# Patient Record
Sex: Male | Born: 1975 | Race: White | Hispanic: No | Marital: Married | Smoking: Current some day smoker
Health system: Southern US, Community
[De-identification: ages and names within clinical notes are randomized; demographics above are authoritative.]

## PROBLEM LIST (undated history)

## (undated) DIAGNOSIS — K259 Gastric ulcer, unspecified as acute or chronic, without hemorrhage or perforation: Secondary | ICD-10-CM

## (undated) DIAGNOSIS — M1990 Unspecified osteoarthritis, unspecified site: Secondary | ICD-10-CM

## (undated) DIAGNOSIS — K859 Acute pancreatitis without necrosis or infection, unspecified: Secondary | ICD-10-CM

## (undated) DIAGNOSIS — N4400 Torsion of testis, unspecified: Secondary | ICD-10-CM

## (undated) DIAGNOSIS — R569 Unspecified convulsions: Secondary | ICD-10-CM

## (undated) DIAGNOSIS — N451 Epididymitis: Secondary | ICD-10-CM

## (undated) HISTORY — PX: TONSILLECTOMY: SUR1361

## (undated) HISTORY — PX: KNEE ARTHROSCOPY: SUR90

## (undated) HISTORY — PX: TESTICULAR EXPLORATION: SHX5145

## (undated) HISTORY — PX: BACK SURGERY: SHX140

## (undated) HISTORY — PX: PALATE / UVULA BIOPSY / EXCISION: SUR128

## (undated) HISTORY — PX: SHOULDER ARTHROSCOPY: SHX128

## (undated) HISTORY — PX: ROTATOR CUFF REPAIR: SHX139

## (undated) HISTORY — PX: WISDOM TOOTH EXTRACTION: SHX21

---

## 2011-01-26 ENCOUNTER — Encounter: Payer: Self-pay | Admitting: *Deleted

## 2011-01-26 ENCOUNTER — Emergency Department (HOSPITAL_BASED_OUTPATIENT_CLINIC_OR_DEPARTMENT_OTHER)
Admission: EM | Admit: 2011-01-26 | Discharge: 2011-01-26 | Disposition: A | Discharge status: Home / Self Care | Payer: Managed Care, Other (non HMO) | Attending: Emergency Medicine | Admitting: Emergency Medicine

## 2011-01-26 ENCOUNTER — Other Ambulatory Visit: Payer: Self-pay

## 2011-01-26 DIAGNOSIS — T7840XA Allergy, unspecified, initial encounter: Secondary | ICD-10-CM

## 2011-01-26 DIAGNOSIS — Y92009 Unspecified place in unspecified non-institutional (private) residence as the place of occurrence of the external cause: Secondary | ICD-10-CM | POA: Insufficient documentation

## 2011-01-26 DIAGNOSIS — S60569A Insect bite (nonvenomous) of unspecified hand, initial encounter: Secondary | ICD-10-CM | POA: Insufficient documentation

## 2011-01-26 MED ORDER — DIPHENHYDRAMINE HCL 50 MG/ML IJ SOLN
25.0000 mg | Freq: Once | INTRAMUSCULAR | Status: AC
  Administered 2011-01-26: 25 mg via INTRAMUSCULAR
  Filled 2011-01-26: qty 1

## 2011-01-26 MED ORDER — METHYLPREDNISOLONE SODIUM SUCC 125 MG IJ SOLR
125.0000 mg | Freq: Once | INTRAMUSCULAR | Status: AC
  Administered 2011-01-26: 125 mg via INTRAMUSCULAR
  Filled 2011-01-26: qty 2

## 2011-01-26 NOTE — ED Notes (Signed)
Patient states he was picking up a old tire and felt a something bite or sting his right palm.  States pain progressively radiated up his right wrist into his upper arm to his chest.  Went to [facility] and received an injection of tordol. On the way home, he developed nausea, vomited x 1, diaphoretic , mid sternal chest pain.  States after vomiting, he feels better.  Is having some dizziness now.

## 2011-01-26 NOTE — ED Notes (Signed)
Patient also states he received a flu shot from his PCP's office prior to leaving. Patient states he has never had a flu shot before.

## 2011-01-26 NOTE — ED Provider Notes (Signed)
History     CSN: [id number] Arrival date & time: 01/26/2011  7:18 PM  Chief Complaint  Patient presents with  . Insect Bite    right palm    (Consider location/radiation/quality/duration/timing/severity/associated sxs/prior treatment) HPI Comments: Pt states that he had some type of insect bite earlier today and he was having a lot of pain in his hand and his wrist so he was seen at [facility] and given a shot of toradol. Pt states that he was also given a flu shot while he was there. Pt states that on his was home he got diaphoretic, had mid sternal cp, vomited times one and his eyes are very itch and he is dizzy:pt state that he has no history of flu shot:pt state that he is feeling a lot better after vomiting although the symptoms have not completely resolved  The history is provided by the patient. No language interpreter was used.    History reviewed. No pertinent past medical history.  Past Surgical History  Procedure Date  . Knee arthroscopy     right and left  . Rotator cuff repair     left  . Shoulder arthroscopy     left  . Tonsillectomy   . Palate / uvula biopsy / excision   . Wisdom tooth extraction   . Testicular exploration     No family history on file.  History  Substance Use Topics  . Smoking status: Current Some Day Smoker  . Smokeless tobacco: Current User    Types: Chew  . Alcohol Use: Yes      Review of Systems  All other systems reviewed and are negative.    Allergies  Review of patient's allergies indicates no known allergies.  Home Medications  No current outpatient prescriptions on file.  BP 144/90  Pulse 76  Temp(Src) 99.4 F (37.4 C) (Oral)  Resp 20  Ht 5\' 11"  (1.803 m)  Wt 227 lb (102.967 kg)  BMI 31.66 kg/m2  SpO2 99%  Physical Exam  Nursing note and vitals reviewed. Constitutional: He is oriented to person, place, and time. He appears well-developed and well-nourished.  HENT:  Head: Normocephalic and atraumatic.    Mouth/Throat: Oropharynx is clear and moist.  Eyes: Conjunctivae are normal. Pupils are equal, round, and reactive to light.  Cardiovascular: Normal rate and regular rhythm.   Pulmonary/Chest: Effort normal and breath sounds normal.  Abdominal: Soft. Bowel sounds are normal.  Musculoskeletal: Normal range of motion.  Neurological: He is alert and oriented to person, place, and time.    ED Course  Procedures (including critical care time)     Date: 01/26/2011  Rate:62  Rhythm: normal sinus rhythm  QRS Axis: normal  Intervals: normal  ST/T Wave abnormalities: early repolarization  Conduction Disutrbances:none  Narrative Interpretation:   Old EKG Reviewed: none available     MDM  Pt is feeling better after the solumedrol and benadryl:likely reaction although unsure if related to one of the 2 medications or the insect bite        Teressa Lower, NP 01/26/11 2031

## 2011-01-26 NOTE — ED Provider Notes (Signed)
Evaluation and management procedures were performed by the mid-level provider (PA/NP/CNM) under my supervision/collaboration. I was present and available during the ED course. Ralph Spivack Y.    Ralph Fowler. Oletta Lamas, MD 01/26/11 2031

## 2012-09-18 DIAGNOSIS — G40209 Localization-related (focal) (partial) symptomatic epilepsy and epileptic syndromes with complex partial seizures, not intractable, without status epilepticus: Secondary | ICD-10-CM | POA: Insufficient documentation

## 2013-06-06 ENCOUNTER — Encounter (HOSPITAL_BASED_OUTPATIENT_CLINIC_OR_DEPARTMENT_OTHER): Payer: Self-pay | Admitting: Emergency Medicine

## 2013-06-06 ENCOUNTER — Emergency Department (HOSPITAL_BASED_OUTPATIENT_CLINIC_OR_DEPARTMENT_OTHER)
Admission: EM | Admit: 2013-06-06 | Discharge: 2013-06-06 | Disposition: A | Discharge status: Home / Self Care | Payer: Managed Care, Other (non HMO) | Attending: Emergency Medicine | Admitting: Emergency Medicine

## 2013-06-06 DIAGNOSIS — Y92410 Unspecified street and highway as the place of occurrence of the external cause: Secondary | ICD-10-CM | POA: Insufficient documentation

## 2013-06-06 DIAGNOSIS — Z79899 Other long term (current) drug therapy: Secondary | ICD-10-CM | POA: Insufficient documentation

## 2013-06-06 DIAGNOSIS — Y9389 Activity, other specified: Secondary | ICD-10-CM | POA: Insufficient documentation

## 2013-06-06 DIAGNOSIS — S335XXA Sprain of ligaments of lumbar spine, initial encounter: Secondary | ICD-10-CM | POA: Insufficient documentation

## 2013-06-06 DIAGNOSIS — Z7982 Long term (current) use of aspirin: Secondary | ICD-10-CM | POA: Insufficient documentation

## 2013-06-06 DIAGNOSIS — F17200 Nicotine dependence, unspecified, uncomplicated: Secondary | ICD-10-CM | POA: Insufficient documentation

## 2013-06-06 DIAGNOSIS — S39012A Strain of muscle, fascia and tendon of lower back, initial encounter: Secondary | ICD-10-CM

## 2013-06-06 HISTORY — DX: Epididymitis: N45.1

## 2013-06-06 HISTORY — DX: Torsion of testis, unspecified: N44.00

## 2013-06-06 HISTORY — DX: Unspecified convulsions: R56.9

## 2013-06-06 HISTORY — DX: Acute pancreatitis without necrosis or infection, unspecified: K85.90

## 2013-06-06 HISTORY — DX: Gastric ulcer, unspecified as acute or chronic, without hemorrhage or perforation: K25.9

## 2013-06-06 MED ORDER — CYCLOBENZAPRINE HCL 10 MG PO TABS: 10.0000 mg | ORAL_TABLET | Freq: Three times a day (TID) | ORAL | Status: DC | PRN
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
## 2013-06-06 NOTE — ED Notes (Signed)
MD at bedside. 
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
## 2013-06-06 NOTE — Discharge Instructions (Signed)
Lumbosacral Strain Lumbosacral strain is a strain of any of the parts that make up your lumbosacral vertebrae. Your lumbosacral vertebrae are the bones that make up the lower third of your backbone. Your lumbosacral vertebrae are held together by muscles and tough, fibrous tissue (ligaments).  CAUSES  A sudden blow to your back can cause lumbosacral strain. Also, anything that causes an excessive stretch of the muscles in the low back can cause this strain. This is typically seen when people exert themselves strenuously, fall, lift heavy objects, bend, or crouch repeatedly. RISK FACTORS  Physically demanding work.  Participation in pushing or pulling sports or sports that require sudden twist of the back (tennis, golf, baseball).  Weight lifting.  Excessive lower back curvature.  Forward-tilted pelvis.  Weak back or abdominal muscles or both.  Tight hamstrings. SIGNS AND SYMPTOMS  Lumbosacral strain may cause pain in the area of your injury or pain that moves (radiates) down your leg.  DIAGNOSIS Your health care provider can often diagnose lumbosacral strain through a physical exam. In some cases, you may need tests such as X-ray exams.  TREATMENT  Treatment for your lower back injury depends on many factors that your clinician will have to evaluate. However, most treatment will include the use of anti-inflammatory medicines. HOME CARE INSTRUCTIONS   Avoid hard physical activities (tennis, racquetball, waterskiing) if you are not in proper physical condition for it. This may aggravate or create problems.  If you have a back problem, avoid sports requiring sudden body movements. Swimming and walking are generally safer activities.  Maintain good posture.  Maintain a healthy weight.  For acute conditions, you may put ice on the injured area.  Put ice in a plastic bag.  Place a towel between your skin and the bag.  Leave the ice on for 20 minutes, 2 3 times a day.  When the  low back starts healing, stretching and strengthening exercises may be recommended. SEEK MEDICAL CARE IF:  Your back pain is getting worse.  You experience severe back pain not relieved with medicines. SEEK IMMEDIATE MEDICAL CARE IF:   You have numbness, tingling, weakness, or problems with the use of your arms or legs.  There is a change in bowel or bladder control.  You have increasing pain in any area of the body, including your belly (abdomen).  You notice shortness of breath, dizziness, or feel faint.  You feel sick to your stomach (nauseous), are throwing up (vomiting), or become sweaty.  You notice discoloration of your toes or legs, or your feet get very cold. MAKE SURE YOU:   Understand these instructions.  Will watch your condition.  Will get help right away if you are not doing well or get worse. Document Released: 01/26/2005 Document Revised: 02/06/2013 Document Reviewed: 12/05/2012 Exit[employer] Patient Information 2014 [employer].  

## 2014-07-15 ENCOUNTER — Encounter (HOSPITAL_COMMUNITY): Payer: Self-pay

## 2014-07-15 ENCOUNTER — Encounter (HOSPITAL_COMMUNITY)
Admission: RE | Admit: 2014-07-15 | Discharge: 2014-07-15 | Disposition: A | Discharge status: Home / Self Care | Payer: BC Managed Care – PPO | Source: Ambulatory Visit | Attending: Orthopedic Surgery | Admitting: Orthopedic Surgery

## 2014-07-15 DIAGNOSIS — R569 Unspecified convulsions: Secondary | ICD-10-CM

## 2014-07-15 DIAGNOSIS — Z01812 Encounter for preprocedural laboratory examination: Secondary | ICD-10-CM | POA: Insufficient documentation

## 2014-07-15 LAB — ABO/RH: ABO/RH(D): O POS

## 2014-07-15 LAB — TYPE AND SCREEN
ABO/RH(D): O POS
ANTIBODY SCREEN: NEGATIVE

## 2014-07-15 LAB — BASIC METABOLIC PANEL
Anion gap: 8 (ref 5–15)
BUN: 8 mg/dL (ref 6–23)
CALCIUM: 8.8 mg/dL (ref 8.4–10.5)
CHLORIDE: 107 mmol/L (ref 96–112)
CO2: 26 mmol/L (ref 19–32)
CREATININE: 1.1 mg/dL (ref 0.50–1.35)
GFR calc Af Amer: >90 mL/min (ref >90)
GFR calc non Af Amer: 84 mL/min — ABNORMAL LOW (ref >90)
GLUCOSE: 122 mg/dL — AB (ref 70–99)
POTASSIUM: 3.8 mmol/L (ref 3.5–5.1)
SODIUM: 141 mmol/L (ref 135–145)

## 2014-07-15 LAB — CBC
HEMATOCRIT: 45 % (ref 39.0–52.0)
Hemoglobin: 15.4 g/dL (ref 13.0–17.0)
MCH: 30.5 pg (ref 26.0–34.0)
MCHC: 34.2 g/dL (ref 30.0–36.0)
MCV: 89.1 fL (ref 78.0–100.0)
Platelets: 153 10*3/uL (ref 150–400)
RBC: 5.05 MIL/uL (ref 4.22–5.81)
RDW: 13.7 % (ref 11.5–15.5)
WBC: 5.9 10*3/uL (ref 4.0–10.5)

## 2014-07-15 LAB — SURGICAL PCR SCREEN
MRSA, PCR: NEGATIVE
Staphylococcus aureus: NEGATIVE

## 2014-07-15 NOTE — Pre-Procedure Instructions (Signed)
Venetia ConstableGary Speir  07/15/2014   Your procedure is scheduled on: Thursday, March 17th   [facility]mitting at 5:30 AM.   Call this number if you have problems the morning of surgery: [phone]   Remember:   Do not eat food or drink liquids after midnight Wednesday.   Take these medicines the morning of surgery with A SIP OF WATER: Dexilant, Oxycodone   Do not wear jewelry - no rings or watches.  Do not wear lotions or colognes.   You may NOT wear deodorant the day of surgery.   Men may shave face and neck.   Do not bring valuables to the hospital.  [facility] is not responsible for any belongings or valuables.               Contacts, dentures or bridgework may not be worn into surgery.  Leave suitcase in the car. After surgery it may be brought to your room.  For patients admitted to the hospital, discharge time is determined by your treatment team.    Name and phone number of your driver:    Special Instructions: "Preparing for Surgery" instruction sheet.   Please read over the following fact sheets that you were given: Pain Booklet, Coughing and Deep Breathing, Blood Transfusion Information, MRSA Information and Surgical Site Infection Prevention

## 2014-07-15 NOTE — Progress Notes (Addendum)
Neuro is Dr. Fae PippinGuzick @ [facility]. [phone]).  Per her instructions, she has given him instructions regarding getting off the keppra.  Last seizure 06/2012.  Denies any cardiac problems.  Just seasonal allergies. PCP is Dr. Jacqlyn LarsenBulla  @ [facility] in [place].   LOV was acouple of weeks ago.

## 2014-07-16 MED ORDER — VANCOMYCIN HCL 10 G IV SOLR
1500.0000 mg | INTRAVENOUS | Status: AC
  Administered 2014-07-17: 1500 mg via INTRAVENOUS
  Filled 2014-07-16: qty 1500

## 2014-07-16 NOTE — Anesthesia Preprocedure Evaluation (Addendum)
Anesthesia Evaluation  Patient identified by MRN, date of birth, ID band Patient awake    Reviewed: Allergy & Precautions, NPO status , Patient's Chart, lab work & pertinent test results  Airway Mallampati: II   Neck ROM: Full    Dental  (+) Dental Advisory Given, Teeth Intact   Pulmonary former smoker,  breath sounds clear to auscultation        Cardiovascular negative cardio ROS  Rhythm:Regular     Neuro/Psych Seizures -,     GI/Hepatic Neg liver ROS, PUD,   Endo/Other  negative endocrine ROS  Renal/GU negative Renal ROS     Musculoskeletal   Abdominal (+) + obese,   Peds  Hematology 15/45   Anesthesia Other Findings   Reproductive/Obstetrics                            Anesthesia Physical Anesthesia Plan  ASA: II  Anesthesia Plan: General   Post-op Pain Management:    Induction: Intravenous  Airway Management Planned: Oral ETT  Additional Equipment:   Intra-op Plan:   Post-operative Plan: Extubation in OR  Informed Consent: I have reviewed the patients History and Physical, chart, labs and discussed the procedure including the risks, benefits and alternatives for the proposed anesthesia with the patient or authorized representative who has indicated his/her understanding and acceptance.     Plan Discussed with:   Anesthesia Plan Comments:         Anesthesia Quick Evaluation

## 2014-07-17 ENCOUNTER — Inpatient Hospital Stay (HOSPITAL_COMMUNITY): Payer: BC Managed Care – PPO

## 2014-07-17 ENCOUNTER — Inpatient Hospital Stay (HOSPITAL_COMMUNITY): Payer: BC Managed Care – PPO | Admitting: Anesthesiology

## 2014-07-17 ENCOUNTER — Encounter (HOSPITAL_COMMUNITY): Payer: Self-pay | Admitting: *Deleted

## 2014-07-17 ENCOUNTER — Inpatient Hospital Stay (HOSPITAL_COMMUNITY)
Admission: RE | Admit: 2014-07-17 | Discharge: 2014-07-23 | DRG: 459 | Disposition: A | Discharge status: Home / Self Care | Payer: BC Managed Care – PPO | Source: Ambulatory Visit | Attending: Orthopedic Surgery | Admitting: Orthopedic Surgery

## 2014-07-17 ENCOUNTER — Encounter (HOSPITAL_COMMUNITY)
Admission: RE | Disposition: A | Discharge status: Home / Self Care | Payer: Self-pay | Source: Ambulatory Visit | Attending: Orthopedic Surgery

## 2014-07-17 DIAGNOSIS — Z6834 Body mass index (BMI) 34.0-34.9, adult: Secondary | ICD-10-CM

## 2014-07-17 DIAGNOSIS — Z9889 Other specified postprocedural states: Secondary | ICD-10-CM

## 2014-07-17 DIAGNOSIS — G9340 Encephalopathy, unspecified: Secondary | ICD-10-CM | POA: Diagnosis not present

## 2014-07-17 DIAGNOSIS — M4316 Spondylolisthesis, lumbar region: Principal | ICD-10-CM | POA: Diagnosis present

## 2014-07-17 DIAGNOSIS — G40909 Epilepsy, unspecified, not intractable, without status epilepticus: Secondary | ICD-10-CM | POA: Diagnosis present

## 2014-07-17 DIAGNOSIS — R4182 Altered mental status, unspecified: Secondary | ICD-10-CM | POA: Insufficient documentation

## 2014-07-17 DIAGNOSIS — Z01812 Encounter for preprocedural laboratory examination: Secondary | ICD-10-CM

## 2014-07-17 DIAGNOSIS — E669 Obesity, unspecified: Secondary | ICD-10-CM | POA: Diagnosis present

## 2014-07-17 DIAGNOSIS — M549 Dorsalgia, unspecified: Secondary | ICD-10-CM | POA: Diagnosis present

## 2014-07-17 DIAGNOSIS — Z981 Arthrodesis status: Secondary | ICD-10-CM

## 2014-07-17 DIAGNOSIS — M5489 Other dorsalgia: Secondary | ICD-10-CM | POA: Diagnosis present

## 2014-07-17 DIAGNOSIS — Z419 Encounter for procedure for purposes other than remedying health state, unspecified: Secondary | ICD-10-CM

## 2014-07-17 DIAGNOSIS — G8918 Other acute postprocedural pain: Secondary | ICD-10-CM | POA: Insufficient documentation

## 2014-07-17 DIAGNOSIS — R410 Disorientation, unspecified: Secondary | ICD-10-CM | POA: Diagnosis not present

## 2014-07-17 DIAGNOSIS — R404 Transient alteration of awareness: Secondary | ICD-10-CM | POA: Diagnosis not present

## 2014-07-17 DIAGNOSIS — F17220 Nicotine dependence, chewing tobacco, uncomplicated: Secondary | ICD-10-CM | POA: Diagnosis present

## 2014-07-17 HISTORY — PX: TRANSFORAMINAL LUMBAR INTERBODY FUSION (TLIF) WITH PEDICLE SCREW FIXATION 1 LEVEL: SHX6141

## 2014-07-17 HISTORY — DX: Unspecified osteoarthritis, unspecified site: M19.90

## 2014-07-17 LAB — CBC
HEMATOCRIT: 40.5 % (ref 39.0–52.0)
HEMOGLOBIN: 13.4 g/dL (ref 13.0–17.0)
MCH: 29.9 pg (ref 26.0–34.0)
MCHC: 33.1 g/dL (ref 30.0–36.0)
MCV: 90.4 fL (ref 78.0–100.0)
Platelets: 167 10*3/uL (ref 150–400)
RBC: 4.48 MIL/uL (ref 4.22–5.81)
RDW: 13.4 % (ref 11.5–15.5)
WBC: 13 10*3/uL — ABNORMAL HIGH (ref 4.0–10.5)

## 2014-07-17 LAB — HEPATIC FUNCTION PANEL
ALBUMIN: 4.3 g/dL (ref 3.5–5.2)
ALT: 38 U/L (ref 0–53)
AST: 67 U/L — ABNORMAL HIGH (ref 0–37)
Alkaline Phosphatase: 65 U/L (ref 39–117)
Bilirubin, Direct: 0.2 mg/dL (ref 0.0–0.5)
Indirect Bilirubin: 0.7 mg/dL (ref 0.3–0.9)
Total Bilirubin: 0.9 mg/dL (ref 0.3–1.2)
Total Protein: 6.8 g/dL (ref 6.0–8.3)

## 2014-07-17 LAB — POCT I-STAT 3, ART BLOOD GAS (G3+)
ACID-BASE DEFICIT: 1 mmol/L (ref 0.0–2.0)
BICARBONATE: 23.2 meq/L (ref 20.0–24.0)
O2 SAT: 92 %
PH ART: 7.421 (ref 7.350–7.450)
PO2 ART: 63 mmHg — AB (ref 80.0–100.0)
TCO2: 24 mmol/L (ref 0–100)
pCO2 arterial: 35.9 mmHg (ref 35.0–45.0)

## 2014-07-17 LAB — BASIC METABOLIC PANEL
Anion gap: 8 (ref 5–15)
BUN: 7 mg/dL (ref 6–23)
CALCIUM: 9.1 mg/dL (ref 8.4–10.5)
CO2: 27 mmol/L (ref 19–32)
CREATININE: 1.26 mg/dL (ref 0.50–1.35)
Chloride: 108 mmol/L (ref 96–112)
GFR calc non Af Amer: 72 mL/min — ABNORMAL LOW (ref >90)
GFR, EST AFRICAN AMERICAN: 83 mL/min — AB (ref >90)
Glucose, Bld: 115 mg/dL — ABNORMAL HIGH (ref 70–99)
POTASSIUM: 4 mmol/L (ref 3.5–5.1)
Sodium: 143 mmol/L (ref 135–145)

## 2014-07-17 LAB — GLUCOSE, CAPILLARY
GLUCOSE-CAPILLARY: 110 mg/dL — AB (ref 70–99)
Glucose-Capillary: 129 mg/dL — ABNORMAL HIGH (ref 70–99)

## 2014-07-17 LAB — AMMONIA: AMMONIA: 39 umol/L — AB (ref 11–32)

## 2014-07-17 LAB — TSH: TSH: 0.197 u[IU]/mL — ABNORMAL LOW (ref 0.350–4.500)

## 2014-07-17 SURGERY — POSTERIOR LUMBAR FUSION 1 LEVEL
Anesthesia: General | Site: Back

## 2014-07-17 MED ORDER — MORPHINE SULFATE 2 MG/ML IJ SOLN
INTRAMUSCULAR | Status: AC
  Administered 2014-07-17: 2 mg via INTRAVENOUS
  Filled 2014-07-17: qty 1

## 2014-07-17 MED ORDER — DEXMEDETOMIDINE BOLUS VIA INFUSION
1.0000 ug/kg | Freq: Once | INTRAVENOUS | Status: AC
  Administered 2014-07-17: 112.5 ug via INTRAVENOUS

## 2014-07-17 MED ORDER — OXYCODONE HCL 5 MG PO TABS
ORAL_TABLET | ORAL | Status: AC
  Administered 2014-07-17: 10 mg
  Filled 2014-07-17: qty 2

## 2014-07-17 MED ORDER — FENTANYL CITRATE 0.05 MG/ML IJ SOLN
25.0000 ug | INTRAMUSCULAR | Status: DC | PRN
  Administered 2014-07-17 (×2): 50 ug via INTRAVENOUS
  Administered 2014-07-17 (×2): 25 ug via INTRAVENOUS

## 2014-07-17 MED ORDER — ONDANSETRON HCL 4 MG/2ML IJ SOLN
INTRAMUSCULAR | Status: AC
  Filled 2014-07-17: qty 2

## 2014-07-17 MED ORDER — OXYCODONE HCL 5 MG PO TABS
ORAL_TABLET | ORAL | Status: AC
  Administered 2014-07-17: 10 mg via ORAL
  Filled 2014-07-17: qty 2

## 2014-07-17 MED ORDER — LACTATED RINGERS IV SOLN
INTRAVENOUS | Status: DC
  Administered 2014-07-18: 01:00:00 via INTRAVENOUS

## 2014-07-17 MED ORDER — SODIUM CHLORIDE 0.9 % IV SOLN: 250.0000 mL | INTRAVENOUS | Status: DC

## 2014-07-17 MED ORDER — ACETAMINOPHEN 10 MG/ML IV SOLN
1000.0000 mg | Freq: Four times a day (QID) | INTRAVENOUS | Status: DC
  Filled 2014-07-17 (×2): qty 100

## 2014-07-17 MED ORDER — MORPHINE SULFATE 2 MG/ML IJ SOLN
INTRAMUSCULAR | Status: AC
  Filled 2014-07-17: qty 1

## 2014-07-17 MED ORDER — LACTATED RINGERS IV SOLN
INTRAVENOUS | Status: DC | PRN
  Administered 2014-07-17: 07:00:00 via INTRAVENOUS

## 2014-07-17 MED ORDER — METHOCARBAMOL 500 MG PO TABS
500.0000 mg | ORAL_TABLET | Freq: Four times a day (QID) | ORAL | Status: DC | PRN
  Administered 2014-07-17 – 2014-07-23 (×17): 500 mg via ORAL
  Filled 2014-07-17 (×20): qty 1

## 2014-07-17 MED ORDER — SODIUM CHLORIDE 0.9 % IJ SOLN: 3.0000 mL | INTRAMUSCULAR | Status: DC | PRN

## 2014-07-17 MED ORDER — LIDOCAINE HCL (CARDIAC) 20 MG/ML IV SOLN
INTRAVENOUS | Status: DC | PRN
  Administered 2014-07-17: 50 mg via INTRAVENOUS
  Administered 2014-07-17: 100 mg via INTRAVENOUS

## 2014-07-17 MED ORDER — THROMBIN 20000 UNITS EX SOLR
CUTANEOUS | Status: DC | PRN
  Administered 2014-07-17: 20 mL via TOPICAL

## 2014-07-17 MED ORDER — MORPHINE SULFATE 2 MG/ML IJ SOLN
1.0000 mg | INTRAMUSCULAR | Status: DC | PRN
  Administered 2014-07-17 (×2): 2 mg via INTRAVENOUS

## 2014-07-17 MED ORDER — ALBUMIN HUMAN 5 % IV SOLN
INTRAVENOUS | Status: DC | PRN
  Administered 2014-07-17 (×2): via INTRAVENOUS

## 2014-07-17 MED ORDER — FENTANYL CITRATE 0.05 MG/ML IJ SOLN
INTRAMUSCULAR | Status: AC
  Filled 2014-07-17: qty 5

## 2014-07-17 MED ORDER — ACETAMINOPHEN 325 MG PO TABS
ORAL_TABLET | ORAL | Status: AC
  Filled 2014-07-17: qty 2

## 2014-07-17 MED ORDER — DIPHENHYDRAMINE HCL 50 MG/ML IJ SOLN: 50.0000 mg | Freq: Once | INTRAMUSCULAR | Status: DC

## 2014-07-17 MED ORDER — DEXAMETHASONE SODIUM PHOSPHATE 4 MG/ML IJ SOLN
4.0000 mg | Freq: Four times a day (QID) | INTRAMUSCULAR | Status: DC
  Administered 2014-07-18 (×2): 4 mg via INTRAVENOUS
  Filled 2014-07-17 (×6): qty 1

## 2014-07-17 MED ORDER — MIDAZOLAM HCL 5 MG/5ML IJ SOLN
INTRAMUSCULAR | Status: DC | PRN
  Administered 2014-07-17 (×2): 2 mg via INTRAVENOUS

## 2014-07-17 MED ORDER — FENTANYL CITRATE 0.05 MG/ML IJ SOLN
INTRAMUSCULAR | Status: AC
  Administered 2014-07-17: 50 ug via INTRAVENOUS
  Filled 2014-07-17: qty 2

## 2014-07-17 MED ORDER — ONDANSETRON HCL 4 MG/2ML IJ SOLN
INTRAMUSCULAR | Status: DC | PRN
  Administered 2014-07-17 (×2): 4 mg via INTRAVENOUS

## 2014-07-17 MED ORDER — SUCCINYLCHOLINE CHLORIDE 20 MG/ML IJ SOLN
INTRAMUSCULAR | Status: AC
  Filled 2014-07-17: qty 2

## 2014-07-17 MED ORDER — DEXMEDETOMIDINE HCL 200 MCG/2ML IV SOLN
INTRAVENOUS | Status: DC | PRN
  Administered 2014-07-17 (×4): 4 ug via INTRAVENOUS

## 2014-07-17 MED ORDER — DIPHENHYDRAMINE HCL 50 MG/ML IJ SOLN
INTRAMUSCULAR | Status: AC
  Filled 2014-07-17: qty 1

## 2014-07-17 MED ORDER — PHENOL 1.4 % MT LIQD
1.0000 | OROMUCOSAL | Status: DC | PRN
  Administered 2014-07-23: 1 via OROMUCOSAL
  Filled 2014-07-17: qty 177

## 2014-07-17 MED ORDER — PROPOFOL 10 MG/ML IV BOLUS
INTRAVENOUS | Status: AC
  Filled 2014-07-17: qty 20

## 2014-07-17 MED ORDER — DEXMEDETOMIDINE HCL IN NACL 200 MCG/50ML IV SOLN
0.2000 ug/kg/h | INTRAVENOUS | Status: DC
  Administered 2014-07-17: 0.6 ug/kg/h via INTRAVENOUS
  Administered 2014-07-17: 0.7 ug/kg/h via INTRAVENOUS
  Administered 2014-07-18 (×2): 0.5 ug/kg/h via INTRAVENOUS
  Administered 2014-07-18: 0.7 ug/kg/h via INTRAVENOUS
  Administered 2014-07-18: 0.6 ug/kg/h via INTRAVENOUS
  Filled 2014-07-17 (×2): qty 50
  Filled 2014-07-17: qty 100
  Filled 2014-07-17: qty 50

## 2014-07-17 MED ORDER — MIDAZOLAM HCL 2 MG/2ML IJ SOLN
INTRAMUSCULAR | Status: AC
  Filled 2014-07-17: qty 2

## 2014-07-17 MED ORDER — ARTIFICIAL TEARS OP OINT
TOPICAL_OINTMENT | OPHTHALMIC | Status: AC
  Filled 2014-07-17: qty 3.5

## 2014-07-17 MED ORDER — THROMBIN 20000 UNITS EX KIT: PACK | CUTANEOUS | Status: DC | PRN

## 2014-07-17 MED ORDER — FENTANYL CITRATE 0.05 MG/ML IJ SOLN
INTRAMUSCULAR | Status: DC | PRN
  Administered 2014-07-17 (×3): 50 ug via INTRAVENOUS
  Administered 2014-07-17: 100 ug via INTRAVENOUS
  Administered 2014-07-17: 50 ug via INTRAVENOUS
  Administered 2014-07-17: 100 ug via INTRAVENOUS
  Administered 2014-07-17 (×2): 50 ug via INTRAVENOUS

## 2014-07-17 MED ORDER — METHOCARBAMOL 1000 MG/10ML IJ SOLN
500.0000 mg | Freq: Four times a day (QID) | INTRAVENOUS | Status: DC | PRN
  Administered 2014-07-18 (×3): 500 mg via INTRAVENOUS
  Filled 2014-07-17 (×6): qty 5

## 2014-07-17 MED ORDER — SODIUM CHLORIDE 0.9 % IJ SOLN
3.0000 mL | Freq: Two times a day (BID) | INTRAMUSCULAR | Status: DC
  Administered 2014-07-18 – 2014-07-22 (×7): 3 mL via INTRAVENOUS

## 2014-07-17 MED ORDER — DEXAMETHASONE SODIUM PHOSPHATE 10 MG/ML IJ SOLN
INTRAMUSCULAR | Status: DC | PRN
  Administered 2014-07-17: 10 mg via INTRAVENOUS

## 2014-07-17 MED ORDER — OXYCODONE HCL 5 MG PO TABS
10.0000 mg | ORAL_TABLET | ORAL | Status: DC | PRN
  Administered 2014-07-17 – 2014-07-23 (×27): 10 mg via ORAL
  Filled 2014-07-17 (×26): qty 2

## 2014-07-17 MED ORDER — ONDANSETRON HCL 4 MG/2ML IJ SOLN: 4.0000 mg | INTRAMUSCULAR | Status: DC | PRN

## 2014-07-17 MED ORDER — ARTIFICIAL TEARS OP OINT
TOPICAL_OINTMENT | OPHTHALMIC | Status: DC | PRN
  Administered 2014-07-17: 1 via OPHTHALMIC

## 2014-07-17 MED ORDER — SUCCINYLCHOLINE CHLORIDE 20 MG/ML IJ SOLN
INTRAMUSCULAR | Status: DC | PRN
  Administered 2014-07-17: 120 mg via INTRAVENOUS

## 2014-07-17 MED ORDER — PROMETHAZINE HCL 25 MG/ML IJ SOLN: 6.2500 mg | INTRAMUSCULAR | Status: DC | PRN

## 2014-07-17 MED ORDER — VANCOMYCIN HCL IN DEXTROSE 1-5 GM/200ML-% IV SOLN
1000.0000 mg | Freq: Once | INTRAVENOUS | Status: AC
  Administered 2014-07-18: 1000 mg via INTRAVENOUS
  Filled 2014-07-17: qty 200

## 2014-07-17 MED ORDER — PROPOFOL 10 MG/ML IV BOLUS
INTRAVENOUS | Status: DC | PRN
  Administered 2014-07-17: 200 mg via INTRAVENOUS
  Administered 2014-07-17 (×3): 100 mg via INTRAVENOUS

## 2014-07-17 MED ORDER — METHOCARBAMOL 500 MG PO TABS
ORAL_TABLET | ORAL | Status: AC
  Administered 2014-07-17: 500 mg via ORAL
  Filled 2014-07-17: qty 1

## 2014-07-17 MED ORDER — DIAZEPAM 5 MG/ML IJ SOLN
INTRAMUSCULAR | Status: AC
  Administered 2014-07-17: 5 mg
  Filled 2014-07-17: qty 2

## 2014-07-17 MED ORDER — THROMBIN 20000 UNITS EX SOLR
CUTANEOUS | Status: AC
  Filled 2014-07-17: qty 20000

## 2014-07-17 MED ORDER — MEPERIDINE HCL 25 MG/ML IJ SOLN: 6.2500 mg | INTRAMUSCULAR | Status: DC | PRN

## 2014-07-17 MED ORDER — HEMOSTATIC AGENTS (NO CHARGE) OPTIME
TOPICAL | Status: DC | PRN
  Administered 2014-07-17: 1 via TOPICAL

## 2014-07-17 MED ORDER — LORAZEPAM 2 MG/ML IJ SOLN: 2.0000 mg | Freq: Once | INTRAMUSCULAR | Status: DC

## 2014-07-17 MED ORDER — SODIUM CHLORIDE 0.9 % IV SOLN
10.0000 mg | INTRAVENOUS | Status: DC | PRN
  Administered 2014-07-17: 20 ug/min via INTRAVENOUS

## 2014-07-17 MED ORDER — MORPHINE SULFATE 2 MG/ML IJ SOLN
1.0000 mg | INTRAMUSCULAR | Status: DC | PRN
  Administered 2014-07-18 (×6): 2 mg via INTRAVENOUS
  Filled 2014-07-17 (×7): qty 1

## 2014-07-17 MED ORDER — BUPIVACAINE-EPINEPHRINE 0.25% -1:200000 IJ SOLN
INTRAMUSCULAR | Status: DC | PRN
  Administered 2014-07-17: 10 mL

## 2014-07-17 MED ORDER — PHENYLEPHRINE HCL 10 MG/ML IJ SOLN
INTRAMUSCULAR | Status: DC | PRN
  Administered 2014-07-17: 40 ug via INTRAVENOUS

## 2014-07-17 MED ORDER — BUPIVACAINE-EPINEPHRINE (PF) 0.25% -1:200000 IJ SOLN
INTRAMUSCULAR | Status: AC
  Filled 2014-07-17: qty 30

## 2014-07-17 MED ORDER — MENTHOL 3 MG MT LOZG: 1.0000 | LOZENGE | OROMUCOSAL | Status: DC | PRN

## 2014-07-17 MED ORDER — DEXMEDETOMIDINE HCL IN NACL 200 MCG/50ML IV SOLN
INTRAVENOUS | Status: AC
  Filled 2014-07-17: qty 100

## 2014-07-17 MED ORDER — FENTANYL CITRATE 0.05 MG/ML IJ SOLN
INTRAMUSCULAR | Status: AC
  Filled 2014-07-17: qty 2

## 2014-07-17 MED ORDER — DEXAMETHASONE 4 MG PO TABS
4.0000 mg | ORAL_TABLET | Freq: Four times a day (QID) | ORAL | Status: DC
  Filled 2014-07-17 (×6): qty 1

## 2014-07-17 MED ORDER — LIDOCAINE HCL (CARDIAC) 20 MG/ML IV SOLN
INTRAVENOUS | Status: AC
  Filled 2014-07-17: qty 5

## 2014-07-17 MED ORDER — 0.9 % SODIUM CHLORIDE (POUR BTL) OPTIME
TOPICAL | Status: DC | PRN
  Administered 2014-07-17: 1000 mL

## 2014-07-17 MED ORDER — PROPOFOL INFUSION 10 MG/ML OPTIME
INTRAVENOUS | Status: DC | PRN
  Administered 2014-07-17: 100 ug/kg/min via INTRAVENOUS

## 2014-07-17 MED ORDER — ACETAMINOPHEN 10 MG/ML IV SOLN
1000.0000 mg | INTRAVENOUS | Status: AC
  Administered 2014-07-17: 1000 mg via INTRAVENOUS
  Filled 2014-07-17: qty 100

## 2014-07-17 SURGICAL SUPPLY — 73 items
CLIP NEUROVISION LG (CLIP) ×2 IMPLANT
CLSR STERI-STRIP ANTIMIC 1/2X4 (GAUZE/BANDAGES/DRESSINGS) ×2 IMPLANT
COVER MAYO STAND STRL (DRAPES) ×2 IMPLANT
COVER SURGICAL LIGHT HANDLE (MISCELLANEOUS) ×2 IMPLANT
DRAPE C-ARM 42X72 X-RAY (DRAPES) ×2 IMPLANT
DRAPE C-ARMOR (DRAPES) ×2 IMPLANT
DRAPE ORTHO SPLIT 77X108 STRL (DRAPES) ×1
DRAPE POUCH INSTRU U-SHP 10X18 (DRAPES) ×2 IMPLANT
DRAPE SURG 17X23 STRL (DRAPES) ×2 IMPLANT
DRAPE SURG ORHT 6 SPLT 77X108 (DRAPES) ×1 IMPLANT
DRAPE U-SHAPE 47X51 STRL (DRAPES) ×2 IMPLANT
DRSG MEPILEX BORDER 4X4 (GAUZE/BANDAGES/DRESSINGS) ×2 IMPLANT
DRSG MEPILEX BORDER 4X8 (GAUZE/BANDAGES/DRESSINGS) ×2 IMPLANT
DURAPREP 26ML APPLICATOR (WOUND CARE) ×2 IMPLANT
ELECT BLADE 4.0 EZ CLEAN MEGAD (MISCELLANEOUS) ×2
ELECT BLADE 6.5 EXT (BLADE) IMPLANT
ELECT PENCIL ROCKER SW 15FT (MISCELLANEOUS) ×2 IMPLANT
ELECT REM PT RETURN 9FT ADLT (ELECTROSURGICAL) ×2
ELECTRODE BLDE 4.0 EZ CLN MEGD (MISCELLANEOUS) ×1 IMPLANT
ELECTRODE REM PT RTRN 9FT ADLT (ELECTROSURGICAL) ×1 IMPLANT
GLOVE BIO SURGEON STRL SZ 6.5 (GLOVE) ×8 IMPLANT
GLOVE BIOGEL PI IND STRL 7.0 (GLOVE) ×3 IMPLANT
GLOVE BIOGEL PI IND STRL 8.5 (GLOVE) ×1 IMPLANT
GLOVE BIOGEL PI INDICATOR 7.0 (GLOVE) ×3
GLOVE BIOGEL PI INDICATOR 8.5 (GLOVE) ×1
GLOVE ECLIPSE 8.5 STRL (GLOVE) ×2 IMPLANT
GOWN STRL REUS W/ TWL LRG LVL3 (GOWN DISPOSABLE) ×1 IMPLANT
GOWN STRL REUS W/TWL 2XL LVL3 (GOWN DISPOSABLE) ×4 IMPLANT
GOWN STRL REUS W/TWL LRG LVL3 (GOWN DISPOSABLE) ×1
GUIDEWIRE NITINOL BEVEL TIP (WIRE) ×2 IMPLANT
KIT BASIN OR (CUSTOM PROCEDURE TRAY) ×2 IMPLANT
KIT NEEDLE NVM5 EMG ELECT (KITS) ×1 IMPLANT
KIT NEEDLE NVM5 EMG ELECTRODE (KITS) ×1
KIT ROOM TURNOVER OR (KITS) ×2 IMPLANT
LIGHT SOURCE ANGLE TIP STR 7FT (MISCELLANEOUS) ×2 IMPLANT
MAS TLIF HOOP SHIM (KITS) ×2 IMPLANT
NEEDLE 22X1 1/2 (OR ONLY) (NEEDLE) ×2 IMPLANT
NEEDLE I-PASS III (NEEDLE) ×2 IMPLANT
NEEDLE SPNL 18GX3.5 QUINCKE PK (NEEDLE) ×4 IMPLANT
NS IRRIG 1000ML POUR BTL (IV SOLUTION) ×2 IMPLANT
PACK LAMINECTOMY ORTHO (CUSTOM PROCEDURE TRAY) ×2 IMPLANT
PACK UNIVERSAL I (CUSTOM PROCEDURE TRAY) ×2 IMPLANT
PAD ARMBOARD 7.5X6 YLW CONV (MISCELLANEOUS) ×4 IMPLANT
PATTIES SURGICAL .5 X.5 (GAUZE/BANDAGES/DRESSINGS) IMPLANT
PATTIES SURGICAL .5 X1 (DISPOSABLE) ×2 IMPLANT
POSITIONER HEAD PRONE TRACH (MISCELLANEOUS) ×2 IMPLANT
PRECEPT SHANK 6.5X45 (Neuro Prosthesis/Implant) ×2 IMPLANT
PRECEPT TULIPS (Neuro Prosthesis/Implant) ×4 IMPLANT
PROBE BALL TIP NVM5 SNG USE (BALLOONS) ×2 IMPLANT
PUTTY DBX 1CC (Putty) ×2 IMPLANT
PUTTY DBX 1CC DEPUY (Putty) ×1 IMPLANT
ROD 45MM (Rod) ×2 IMPLANT
ROD 50MM (Rod) ×2 IMPLANT
SCREW PRECEPT POLY 7.5X45MM (Screw) ×4 IMPLANT
SCREW PRECEPT SET (Screw) ×8 IMPLANT
SCREW PRECEPT SHANK MOD 7.5X50 (Screw) ×2 IMPLANT
SHEET CONFORM 45LX20WX5H (Bone Implant) ×2 IMPLANT
SPONGE LAP 4X18 X RAY DECT (DISPOSABLE) ×4 IMPLANT
SPONGE SURGIFOAM ABS GEL 100 (HEMOSTASIS) ×2 IMPLANT
SURGIFLO TRUKIT (HEMOSTASIS) ×2 IMPLANT
SUT BONE WAX W31G (SUTURE) ×4 IMPLANT
SUT MNCRL AB 3-0 PS2 18 (SUTURE) ×4 IMPLANT
SUT VIC AB 1 CT1 18XCR BRD 8 (SUTURE) ×1 IMPLANT
SUT VIC AB 1 CT1 8-18 (SUTURE) ×1
SUT VIC AB 2-0 CT1 18 (SUTURE) ×2 IMPLANT
SYR BULB IRRIGATION 50ML (SYRINGE) ×2 IMPLANT
SYR CONTROL 10ML LL (SYRINGE) ×2 IMPLANT
TLIF XLRG 11MM (Neuro Prosthesis/Implant) ×2 IMPLANT
TOWEL OR 17X24 6PK STRL BLUE (TOWEL DISPOSABLE) ×2 IMPLANT
TOWEL OR 17X26 10 PK STRL BLUE (TOWEL DISPOSABLE) ×2 IMPLANT
TRAY FOLEY CATH 16FRSI W/METER (SET/KITS/TRAYS/PACK) ×2 IMPLANT
WATER STERILE IRR 1000ML POUR (IV SOLUTION) ×2 IMPLANT
YANKAUER SUCT BULB TIP NO VENT (SUCTIONS) ×2 IMPLANT

## 2014-07-17 NOTE — Transfer of Care (Signed)
Immediate Anesthesia Transfer of Care Note  Patient: Ralph Fowler  Procedure(s) Performed: Procedure(s): TRANSFORAMINAL LUMBAR INTERBODY FUSION (TLIF) WITH PEDICLE SCREW FIXATION 1 LEVEL L4-5 (N/A)  Patient Location: PACU  Anesthesia Type:General  Level of Consciousness: sedated, patient cooperative and responds to stimulation  Airway & Oxygen Therapy: Patient Spontanous Breathing and Patient connected to face mask oxygen  Post-op Assessment: Report given to RN, Post -op Vital signs reviewed and stable, Patient moving all extremities and Patient moving all extremities X 4  Post vital signs: Reviewed and stable  Last Vitals:  Filed Vitals:   07/17/14 0607  BP: 137/89  Pulse: 90  Temp: 36.8 C  Resp: 20    Complications: No apparent anesthesia complications

## 2014-07-17 NOTE — Progress Notes (Signed)
Note dictated:  Job no.  [id number] Patient with c/o of severe pain, and agitation. Unable to obtain CT scan given patients current status Spoke with CCM - plan on transfer to unit to start precedex

## 2014-07-17 NOTE — Progress Notes (Addendum)
this note was charted on the wrong  PACU patient;  it is irrelevant to Ralph Fowler's post-op status    Patient completed hour of ventilation.  Much more awake and resposive.  2200cc of urine diuresed.  Good respiratory effort.  Extubated and placed on mask.Marland Kitchen. Resp rate 21, Oxygen Sat 97%, awake and complaining of some pain.  Will medicate carefully.  Reccomend sending to step down unit  overnight for enhanced monitoring.

## 2014-07-17 NOTE — Progress Notes (Signed)
ANTIBIOTIC CONSULT NOTE - INITIAL  Pharmacy Consult:  Vancomycin Indication:  Surgical prophylaxis  No Known Allergies  Patient Measurements: Height: 5\' 11"  (180.3 cm) Weight: 248 lb (112.492 kg) IBW/kg (Calculated) : 75.3  Vital Signs: Temp: 99.7 F (37.6 C) (03/17 1810) BP: 152/84 mmHg (03/17 1810) Pulse Rate: 86 (03/17 1810)  Labs:  Recent Labs  07/15/14 1515  WBC 5.9  HGB 15.4  PLT 153  CREATININE 1.10   Estimated Creatinine Clearance: 117.3 mL/min (by C-G formula based on Cr of 1.1). No results for input(s): VANCOTROUGH, VANCOPEAK, VANCORANDOM, GENTTROUGH, GENTPEAK, GENTRANDOM, TOBRATROUGH, TOBRAPEAK, TOBRARND, AMIKACINPEAK, AMIKACINTROU, AMIKACIN in the last 72 hours.   Microbiology: Recent Results (from the past 720 hour(s))  Surgical pcr screen     Status: None   Collection Time: 07/15/14  3:16 PM  Result Value Ref Range Status   MRSA, PCR NEGATIVE NEGATIVE Final   Staphylococcus aureus NEGATIVE NEGATIVE Final    Comment:        The Xpert SA Assay (FDA approved for NASAL specimens in patients over 37 years of age), is one component of a comprehensive surveillance program.  Test performance has been validated by [facility] for patients greater than or equal to 37 year old. It is not intended to diagnose infection nor to guide or monitor treatment.     Medical History: Past Medical History  Diagnosis Date  . Stomach ulcer   . Pancreatitis   . Testicular torsion   . Epididymitis   . Seizures     last grand mal was in 06/2012      Assessment: 39 YOM s/p TLIF with pedicle screw fixation to receive one dose of vancomycin post-op for surgical prophylaxis.  Patient does not have a drain per documentation.  No issue with renal function per pre-op labs.  He received his pre-op vancomycin dose around 0730 today.   Goal of Therapy:  Infection prevention   Plan:  - Vanc 1gm IV x 1 now - Pharmacy will sign off.  Thank you for the  consult!   Jayln Branscom D. Laney Potashang, PharmD, BCPS Pager:  [phone] 07/17/2014, 7:55 PM

## 2014-07-17 NOTE — H&P (Signed)
History of Present Illness  The patient is a 39 year old male who presents with back pain. The patient reports low back symptoms including numbness (left leg) and tingling which began 7 month(s) ago following a specific injury (on 08/20/13 while repetative bending lifting and twisting, felt a sharp pain in the mid low back. He was seen by a Physician at [facility] 2 days later. He had xrays and was given a Lumbar Corset, had an MRI, unclear of the location). The injury occurred at work due to twisting, lifting and bending. and Symptoms include pain (mid low back that radiates to the right flank), muscle spasm (low back and buttocks), decreased range of motion, paresthesias (left calf), numbness (left toes, 2nd, 3rd, and 1/2 of the 4th), burning, stiffness and weakness (left left), while symptoms do not include incontinence of stool or incontinence of urine. The patient describes the pain as sharp, burning and aching. The patient describes the severity of their symptoms as moderate in severity ("slightly less than moderate"). The patient feels as if the symptoms are unchanging. Symptoms are exacerbated by sitting, while symptoms are not exacerbated by recumbency. Current treatment includes opioid analgesics (Vicodin 10/325 Rx'd by PCP), muscle relaxants (Methocarbomol 500 mg tid Rx'd by PCP) and activity modification. Prior to being seen today the patient was previously evaluated in this clinic. Past evaluation has included x-ray of the lumbar spine and MRI of the lumbar spine. Past treatment has included muscle relaxants, corticosteroids and lumbar support.   The patient comes in today for a preoperative History and Physical. The patient is scheduled for a TLIF L4-5 to be performed by Dr. Debria Garretahari D. Shon BatonBrooks, MD [facility] on 07/17/14 . Please see the hospital record for complete dictated history and physical.  Additional reasons for visit:  Back pain is described as the following: The patient  is here today Patients surgery was denied by WC. All chart notes are under the [phone] WC #. symptoms following a specific injury (08/20/13 while twisting and bending at work).  Allergies No Known Drug Allergies03/03/2015  Vitals 07/11/2014 2:00 PM Weight: 230 lb Height: 71in Weight was reported by patient. Body Surface Area: 2.24 m Body Mass Index: 32.08 kg/m  Pulse: 96 (Regular)  BP: 150/85 (Sitting, Left Arm, Standard)   Physical Exam  General Mental Status -Alert and cooperative.  Chest and Lung Exam Chest and lung exam reveals -normal excursion with symmetric chest walls, non-tender and normal tactile fremitus and on auscultation, normal breath sounds, no adventitious sounds and normal vocal resonance.  Cardiovascular Cardiovascular examination reveals -on palpation PMI is normal in location and amplitude, no palpable S3 or S4. Normal cardiac borders..  Peripheral Vascular Lower Extremity Palpation - Pulses - Bilateral - pulses intact. Calf - Bilateral - soft/supple to palpation, appearance is not suggestive of DVT.  Neurologic Motor Lower Extremity - Left - Motor function is diminished in the lower extremity. Right - Motor function is intact in the lower extremity. Sensation Lower Extremity - Left - sensation is diminished to light touch in the lower extremity. Right - sensation is intact in the lower extremity. Testing Hoffman's Sign - No Hoffman's sign present. Seated Straight Leg Raise - Bilateral - Seated straight leg raise negative.  Musculoskeletal Spine/Ribs/Pelvis  Lumbosacral Spine: Assessment of pain reveals the following findings - The pain is characterized as - severe, burning and constant ache. Location - pain refers to posterior leg on affected side. Location - lumbar area and left lower leg. Lumbosacral Spine -  ROM - painful. Waddell's Signs - no Waddell's signs present.  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  No  significant change fom previous MRI  Assessment & Plan  Prescription for Physical Therapy given. Patient instructed to schedule therapy either through [facility] or their preferred physical therapy provider. Spondylolisthesis of lumbar region    The patient presents today for his pre op H&P. All appropriate risks, benefits, and alternatives to surgery were discussed with the patient. The risks of that include infection, bleeding, nerve damage, death, stoke, paralysis, failure to heal, need for further surgery, ongoing or worse pain, leak of spinal fluid, nerve damage, loss of bowel and bladder control, blood clots, adjacent segment disease. All of his questions were addressed.

## 2014-07-17 NOTE — Anesthesia Postprocedure Evaluation (Signed)
  Anesthesia Post-op Note  Patient: Ralph ConstableGary Lodge  Procedure(s) Performed: Procedure(s): TRANSFORAMINAL LUMBAR INTERBODY FUSION (TLIF) WITH PEDICLE SCREW FIXATION 1 LEVEL L4-5 (N/A)  Patient Location: PACU  Anesthesia Type:General  Level of Consciousness: awake and alert   Airway and Oxygen Therapy: Patient Spontanous Breathing and Patient connected to nasal cannula oxygen  Post-op Pain: mild  Post-op Assessment: Post-op Vital signs reviewed and Patient's Cardiovascular Status Stable  Post-op Vital Signs: Reviewed and stable  Last Vitals:  Filed Vitals:   07/17/14 1155  BP: 110/53  Pulse: 77  Temp: 36.7 C  Resp: 28    Complications: No apparent anesthesia complications

## 2014-07-17 NOTE — Significant Event (Signed)
Rapid Response Event Note  Overview:  Called to assist with patient with increasing confusion post-op Time Called: 1830 Arrival Time: 1840 Event Type: Neurologic, Other (Comment)  Initial Focused Assessment: Patient alert warm very diaphoretic - mae x 4 - agitated - thrashing in bed - screaming in pain - will focus and follow commands - speech clear - other times thrashing screaming - oriented to person place and appropriate response then will scream and moan in pain.  Bil BS = clear - resps without distress - abd soft - denies pain there - has voided clear yellow urine without problems - no chest pain - denies SOB  - RN Nettie ElmSylvia at bedside - patient is post op one level back surgery - DDI - no hematoma noted at site - pupils 4 mm equal and reactive - flushing of arms face and upper torso - HR 86 regular - BP 158/89 RR 22 O2 sats 100% on RA.    CBG 129.   Interventions:  Attempt to reassure the patient - family at bedside - states he has never had reaction like this before and has had many surgeries.  Does have hx of seizure but no seizure activity noted now.  Arsenio LoaderBryson  Stilwell PA with orthopedics here - patient continue to thrash in pain.  Morphine ordered - patient pulled IV out =- #20 angio restarted left forearm times one attempt - labs sent - Morphine 2 mg IV given for pain.  Dr. Shon BatonBrooks present - exam done - speaking with family - allowed patient to sit up with no relief - Valium 5 mg IV given per order - no relief.  Patient continues to thrash- vitals remain 126/84 HR 78 O2 sats 100 % .  PCCM consulted per Dr. Shon BatonBrooks - Joneen RoachPaul Hoffman NP to bedside - stat transfer to 912m09 - continues to thrash - still able to focus and follow commands.  Handoff to    Event Summary: Name of Physician Notified: Dr. Valentino Nosehari Brooks at  (pta rrt)  Name of Consulting Physician Notified: PCCM  at 2030  Outcome: Transferred (Comment) [phone]([id number])  Event End Time: 2115  Delton PrairieBritt, Emberlyn Burlison L

## 2014-07-17 NOTE — Consult Note (Signed)
PULMONARY / CRITICAL CARE MEDICINE   Name: Ralph Fowler MRN: [id number] DOB: 1975/11/11    ADMISSION DATE:  07/17/2014 CONSULTATION DATE:  07/17/2014  REFERRING MD :  D. Brooks  CHIEF COMPLAINT:  Agitation  INITIAL PRESENTATION: 39 year old male presented to [facility] for L spine procedure 3/17 without complication. In PACU he was slow to wake up. Once on floor he developed severe agitation and back pain which was refractory to narcotic pain treatment. PCCM to consult.  STUDIES:   SIGNIFICANT EVENTS: 3/17 TRANSFORAMINAL LUMBAR INTERBODY FUSION (TLIF) WITH PEDICLE SCREW FIXATION 1 LEVEL L4-5 (N/A)  HISTORY OF PRESENT ILLNESS:  39 year old male with PMH as below, which is significant for PUD, Pancreatitis, and seizures. Also recent history of back pain after bending, twisting injury at work. He presented for surgical repair of this on 3/17 under Dr. Sheela Stack. Surgery was without complication. Post-operatively he was slow to wake up in PACU but was ultimately able to protect his airway and remain extubated. He was transferred to floor where he developed back pain and severe agitation/delerium which aws refractory to common modalities such as morphine, haldol, ativan. PCCM was consulted for further evaluation.   PAST MEDICAL HISTORY :   has a past medical history of Stomach ulcer; Pancreatitis; Testicular torsion; Epididymitis; and Seizures.  has past surgical history that includes Knee arthroscopy; Rotator cuff repair; Shoulder arthroscopy; Tonsillectomy; Palate / uvula biopsy / excision; Wisdom tooth extraction; and Testicular exploration. Prior to Admission medications   Medication Sig Start Date End Date Taking? Authorizing Provider  methocarbamol (ROBAXIN) 500 MG tablet Take 500 mg by mouth every 6 (six) hours as needed for muscle spasms.   Yes Historical Provider, MD  cyclobenzaprine (FLEXERIL) 10 MG tablet Take 1 tablet (10 mg total) by mouth 3 (three) times daily as needed for muscle spasms.  06/06/13   Susy Frizzle, MD  Dexlansoprazole (DEXILANT) 30 MG capsule Take 30 mg by mouth 2 (two) times daily.    Historical Provider, MD  diclofenac sodium (VOLTAREN) 1 % GEL Apply topically daily as needed. For pain     Historical Provider, MD  INFLUENZA A, H1N1, MONOVAL VAC IM Inject into the muscle once.      Historical Provider, MD   No Known Allergies  FAMILY HISTORY:  has no family status information on file.  SOCIAL HISTORY:  reports that he has quit smoking. His smokeless tobacco use includes Chew. He reports that he drinks about 1.8 oz of alcohol per week. He reports that he does not use illicit drugs.  REVIEW OF SYSTEMS:    Bolds are positive  Constitutional: weight loss, gain, night sweats, Fevers, chills, fatigue .  HEENT: headaches, Sore throat, sneezing, nasal congestion, post nasal drip, Difficulty swallowing, Tooth/dental problems, visual complaints visual changes, ear ache CV:  chest pain, radiates: ,Orthopnea, PND, swelling in lower extremities, dizziness, palpitations, syncope.  GI  heartburn, indigestion, abdominal pain, nausea, vomiting, diarrhea, change in bowel habits, loss of appetite, bloody stools.  Resp: cough, productive: , hemoptysis, dyspnea, chest pain, pleuritic.  Skin: rash or itching or icterus GU: dysuria, change in color of urine, urgency or frequency. flank pain, hematuria  MS: Back pain or swelling. decreased range of motion. Mild paresthesia to BLE Psych: change in mood or affect. depression or anxiety.  Neuro: difficulty with speech, weakness, numbness, ataxia     SUBJECTIVE:   VITAL SIGNS: Temp:  [98 F (36.7 C)-99.7 F (37.6 C)] 99.7 F (37.6 C) (03/17 1810) Pulse Rate:  [  70-94] 86 (03/17 1810) Resp:  [15-35] 18 (03/17 1810) BP: (93-158)/(53-89) 126/84 mmHg (03/17 1930) SpO2:  [93 %-100 %] 97 % (03/17 1810) Weight:  [112.492 kg (248 lb)] 112.492 kg (248 lb) (03/17 0607) HEMODYNAMICS:   VENTILATOR SETTINGS:   INTAKE /  OUTPUT:  Intake/Output Summary (Last 24 hours) at 07/17/14 2114 Last data filed at 07/17/14 1440  Gross per 24 hour  Intake   3100 ml  Output   2800 ml  Net    300 ml    PHYSICAL EXAMINATION: General:  Obese male in moderate distress Neuro:  Agitated, oriented x 4 once calmed down HEENT:  [place]/AT, PERRL, no JVD Cardiovascular:  Tachy, regular, no MRG Lungs:  Clear bilateral breath sounds. Tachypnea  Abdomen:  Soft, non-tender, non-distended Musculoskeletal:  No acute deformity or ROM limitation Skin:  Grossly intact  LABS:  CBC  Recent Labs Lab 07/15/14 1515  WBC 5.9  HGB 15.4  HCT 45.0  PLT 153   Coag's No results for input(s): APTT, INR in the last 168 hours. BMET  Recent Labs Lab 07/15/14 1515  NA 141  K 3.8  CL 107  CO2 26  BUN 8  CREATININE 1.10  GLUCOSE 122*   Electrolytes  Recent Labs Lab 07/15/14 1515  CALCIUM 8.8   Sepsis Markers No results for input(s): LATICACIDVEN, PROCALCITON, O2SATVEN in the last 168 hours. ABG No results for input(s): PHART, PCO2ART, PO2ART in the last 168 hours. Liver Enzymes No results for input(s): AST, ALT, ALKPHOS, BILITOT, ALBUMIN in the last 168 hours. Cardiac Enzymes No results for input(s): TROPONINI, PROBNP in the last 168 hours. Glucose  Recent Labs Lab 07/17/14 1904  GLUCAP 129*    Imaging No results found.   ASSESSMENT / PLAN:   Acute encephalopathy - cause uncertain at this time. Possibly reaction to anesthesia, history of pancreatitis raises concern for EtOH withdrawal, however, family and patient report no alcohol use. Seizure history, but neurology has seen and does not believe this is related. Consider hepatic encephalopathy, but doubt given that AST and Ammonia only mildly elevated. Patient writhing in bed at time of my assessment. He is agitated and angry making uninteligible sounds, which are presumably secondary to pain/delerium, however if the nurses are able to calm him down he is able  to answer orientation questions correctly and carry conversation. Shorlty after that he will return to his agitated state. He has also made some comments that lead me to believe there is a component of confusion/delerium.   - Transfer to ICU for closer monitoring  - Start precedex infusion titrating to RASS goal 0 to -1.   - Repeat Bmet/CBC - CT head  S/p L4-5 screw fixation - Strict bed rest - Pain management - Dr. Shon BatonBrooks following - CT L-spine  Elevated AST, Ammonia - follow labs - consider lactulose if delirium will not resolve - D/C IV acetaminophen   R/o Hyperthyroid (TSH low) - Send free T4  Diet: NPO VTE ppx: defer to primary

## 2014-07-17 NOTE — Progress Notes (Addendum)
Received from PACU allert restless thrashing around in bed, incont lg amts of urine.Marciano SequinBryson Stillwell PA in to see.Dr Shon BatonBrooks in [place] 2mg  IV for pain given ,Unable to do CT at this time due to pt restlessness.CBG 129 BP158/88 O2 sat 98% on RA. Rapid Response nurse called. Pt ha s fine red rash arms neck face.

## 2014-07-17 NOTE — Anesthesia Procedure Notes (Addendum)
Procedure Name: Intubation Date/Time: 07/17/2014 7:42 AM Performed by: Wray KearnsFOLEY, Jafari Mckillop A Pre-anesthesia Checklist: Patient identified, Timeout performed, Emergency Drugs available, Suction available and Patient being monitored Patient Re-evaluated:Patient Re-evaluated prior to inductionOxygen Delivery Method: Circle system utilized Preoxygenation: Pre-oxygenation with 100% oxygen Intubation Type: IV induction and Cricoid Pressure applied Ventilation: Mask ventilation without difficulty Laryngoscope Size: Mac and 4 Grade View: Grade I Tube type: Oral Tube size: 8.5 mm Number of attempts: 1 Airway Equipment and Method: Stylet Placement Confirmation: ETT inserted through vocal cords under direct vision,  breath sounds checked- equal and bilateral and positive ETCO2 Secured at: 23 cm Tube secured with: Tape Dental Injury: Teeth and Oropharynx as per pre-operative assessment

## 2014-07-17 NOTE — Consult Note (Signed)
Consult Reason for Consult: altered mental status Referring Physician: Dr Shon Baton  CC: altered mental status  HPI: Ralph Fowler is an 39 y.o. male with history of seizure disorder admitted with back pain is s/p a transforaminal lumbar interbody fusion at L4-5 this morning. Post procedure noted to be agitated and disoriented. Family notes he can get like this after he has had a seizure. Of note he does not take any seizure medications at home. Last seizure was Nov 2014, he had extensive workup with unclear etiology. Was on keppra in the past but stopped after no further seizures.  Per Dr Shon Baton, he was noted to be very somnolent and difficult to arouse post procedure. Patient currently alert, restless in bed, stating he is in severe pain. Wife notes mental status fluctuates, he will make perfect sense and then go off on tangents.   Post procedure he received multiple doses of fentanyl, a dose of robaxin, a dose of morphine and an oxycodone  dose.   Past Medical History  Diagnosis Date  . Stomach ulcer   . Pancreatitis   . Testicular torsion   . Epididymitis   . Seizures     last grand mal was in 06/2012    Past Surgical History  Procedure Laterality Date  . Knee arthroscopy      right and left  . Rotator cuff repair      left  . Shoulder arthroscopy      left  . Tonsillectomy    . Palate / uvula biopsy / excision    . Wisdom tooth extraction    . Testicular exploration      History reviewed. No pertinent family history.  Social History:  reports that he has quit smoking. His smokeless tobacco use includes Chew. He reports that he drinks about 1.8 oz of alcohol per week. He reports that he does not use illicit drugs.  No Known Allergies  Medications:  Scheduled: . acetaminophen  1,000 mg Intravenous 4 times per day  . acetaminophen      . morphine      . morphine      . oxyCODONE        ROS: Out of a complete 14 system review, the patient complains of only the  following symptoms, and all other reviewed systems are negative. +pain  Physical Examination: Filed Vitals:   07/17/14 1810  BP: 152/84  Pulse: 86  Temp: 99.7 F (37.6 C)  Resp: 18   Physical Exam  Constitutional: He appears well-developed and well-nourished.  Psych: Affect appropriate to situation Eyes: No scleral injection HENT: No OP obstrucion Head: Normocephalic.  Cardiovascular: Normal rate and regular rhythm.  Respiratory: Effort normal and breath sounds normal.  GI: Soft. Bowel sounds are normal. No distension. There is no tenderness.  Skin: WDI  Neurologic Examination Mental Status: Alert, oriented to name, date, location, thought content mostly appropriate, fluent without evidence of aphasia.  Able to follow 3 step commands without difficulty. Cranial Nerves: II: optic discs not visualized, visual fields grossly normal, pupils equal, round, reactive to light  III,IV, VI: ptosis not present, extra-ocular motions intact bilaterally V,VII: smile symmetric, facial light touch sensation normal bilaterally VIII: hearing normal bilaterally IX,X: cough reflex present XI: trapezius strength/neck flexion strength normal bilaterally XII: tongue strength normal  Motor: Moves all extremities symmetrically and against light resistance Tone and bulk:normal tone throughout; no atrophy noted Sensory:  light touch intact throughout, bilaterally Deep Tendon Reflexes: 2+ and symmetric throughout Plantars: Right: downgoing  Left: downgoing Cerebellar: Unable to test Gait: deferred  Laboratory Studies:   Basic Metabolic Panel:  Recent Labs Lab 07/15/14 1515  NA 141  K 3.8  CL 107  CO2 26  GLUCOSE 122*  BUN 8  CREATININE 1.10  CALCIUM 8.8    Liver Function Tests: No results for input(s): AST, ALT, ALKPHOS, BILITOT, PROT, ALBUMIN in the last 168 hours. No results for input(s): LIPASE, AMYLASE in the last 168 hours. No results for input(s): AMMONIA in the last  168 hours.  CBC:  Recent Labs Lab 07/15/14 1515  WBC 5.9  HGB 15.4  HCT 45.0  MCV 89.1  PLT 153    Cardiac Enzymes: No results for input(s): CKTOTAL, CKMB, CKMBINDEX, TROPONINI in the last 168 hours.  BNP: Invalid input(s): POCBNP  CBG: No results for input(s): GLUCAP in the last 168 hours.  Microbiology: Results for orders placed or performed during the hospital encounter of 07/15/14  Surgical pcr screen     Status: None   Collection Time: 07/15/14  3:16 PM  Result Value Ref Range Status   MRSA, PCR NEGATIVE NEGATIVE Final   Staphylococcus aureus NEGATIVE NEGATIVE Final    Comment:        The Xpert SA Assay (FDA approved for NASAL specimens in patients over 37 years of age), is one component of a comprehensive surveillance program.  Test performance has been validated by [facility] for patients greater than or equal to 10535 year old. It is not intended to diagnose infection nor to guide or monitor treatment.     Coagulation Studies: No results for input(s): LABPROT, INR in the last 72 hours.  Urinalysis: No results for input(s): COLORURINE, LABSPEC, PHURINE, GLUCOSEU, HGBUR, BILIRUBINUR, KETONESUR, PROTEINUR, UROBILINOGEN, NITRITE, LEUKOCYTESUR in the last 168 hours.  Invalid input(s): APPERANCEUR  Lipid Panel:  No results found for: CHOL, TRIG, HDL, CHOLHDL, VLDL, LDLCALC  HgbA1C: No results found for: HGBA1C  Urine Drug Screen:  No results found for: LABOPIA, COCAINSCRNUR, LABBENZ, AMPHETMU, THCU, LABBARB  Alcohol Level: No results for input(s): ETH in the last 168 hours.   Imaging: Dg Lumbar Spine 2-3 Views  07/17/2014   CLINICAL DATA:  L4-L5 TLIF.  EXAM: DG C-ARM 61-120 MIN; LUMBAR SPINE - 2-3 VIEW  COMPARISON:  Lumbar spine MRI 10/05/2013.  FINDINGS: Posterior lumbar spinal fusion hardware demonstrated at the L4-5 level. Intervertebral disc spacer present L4-5. No acute abnormality.  IMPRESSION: Postoperative changes compatible with L4-5 fusion.    Electronically Signed   By: Annia Beltrew  Davis M.D.   On: 07/17/2014 15:28   Dg C-arm 61-120 Min  07/17/2014   CLINICAL DATA:  L4-L5 TLIF.  EXAM: DG C-ARM 61-120 MIN; LUMBAR SPINE - 2-3 VIEW  COMPARISON:  Lumbar spine MRI 10/05/2013.  FINDINGS: Posterior lumbar spinal fusion hardware demonstrated at the L4-5 level. Intervertebral disc spacer present L4-5. No acute abnormality.  IMPRESSION: Postoperative changes compatible with L4-5 fusion.   Electronically Signed   By: Annia Beltrew  Davis M.D.   On: 07/17/2014 15:28     Assessment/Plan:  39y/o gentleman with history of seizure disorder presenting with chronic back pain. He is s/p L4-5 lumbar fusion this morning. Post-procedure initially noted to be lethargic and difficult to arouse. Currently appears agitated/restless and is oriented x 4 with some tangential though processes. Based on intact mental status have low suspicion that he is actively seizing. This could theoretically represent a post-ictal state due to a seizure in the perioperative state as this would have lowered his seizure threshold.  Differential also includes metabolic vs polypharmacy. Low suspicion for a CVA as exam is overall non-focal.   -check head CT -check ammonia, TSH, hepatic function -if mental status does not improve will consider EEG -pain control per primary team  Elspeth Cho, DO [facility] [phone]  If 7pm- 7am, please page neurology on call as listed in AMION. 07/17/2014, 6:26 PM

## 2014-07-17 NOTE — Progress Notes (Addendum)
Pt has 5 north ready bed and he is adamant about getting to his room so he can eat. Pt is still disoriented & restless at times, but is easily reoriented most of the time. Pt co's of continuing back pain.  Anesthesia @ bedside to assess patient. Agrees with plan to give Oxy IR & transport patient to his room.

## 2014-07-17 NOTE — Brief Op Note (Signed)
07/17/2014  11:11 AM  PATIENT:  Ralph Fowler  39 y.o. male  PRE-OPERATIVE DIAGNOSIS:  L4-5 SLIP WITH BILATERAL PAIN DEFECT  POST-OPERATIVE DIAGNOSIS:  L4-5 SLIP WITH BILATERAL PAIN DEFECT  PROCEDURE:  Procedure(s): TRANSFORAMINAL LUMBAR INTERBODY FUSION (TLIF) WITH PEDICLE SCREW FIXATION 1 LEVEL L4-5 (N/A)  SURGEON:  Surgeon(s) and Role:    * Venita Lickahari Taedyn Glasscock, MD - Primary  PHYSICIAN ASSISTANT:   ASSISTANTS: none   ANESTHESIA:   general  EBL:  Total I/O In: 2800 [I.V.:2300; IV Piggyback:500] Out: 1000 [Urine:500; Blood:500]  BLOOD ADMINISTERED:none  DRAINS: none   LOCAL MEDICATIONS USED:  MARCAINE     SPECIMEN:  No Specimen  DISPOSITION OF SPECIMEN:  N/A  COUNTS:  YES  TOURNIQUET:  * No tourniquets in log *  DICTATION: .Other Dictation: Dictation Number [id number]  PLAN OF CARE: Admit to inpatient   PATIENT DISPOSITION:  PACU - hemodynamically stable.

## 2014-07-18 ENCOUNTER — Inpatient Hospital Stay (HOSPITAL_COMMUNITY): Payer: BC Managed Care – PPO

## 2014-07-18 ENCOUNTER — Encounter (HOSPITAL_COMMUNITY): Payer: Self-pay

## 2014-07-18 DIAGNOSIS — G9340 Encephalopathy, unspecified: Secondary | ICD-10-CM | POA: Insufficient documentation

## 2014-07-18 DIAGNOSIS — Z9889 Other specified postprocedural states: Secondary | ICD-10-CM | POA: Insufficient documentation

## 2014-07-18 DIAGNOSIS — Z981 Arthrodesis status: Secondary | ICD-10-CM | POA: Insufficient documentation

## 2014-07-18 DIAGNOSIS — R4182 Altered mental status, unspecified: Secondary | ICD-10-CM | POA: Insufficient documentation

## 2014-07-18 DIAGNOSIS — G8918 Other acute postprocedural pain: Secondary | ICD-10-CM | POA: Insufficient documentation

## 2014-07-18 DIAGNOSIS — R410 Disorientation, unspecified: Secondary | ICD-10-CM

## 2014-07-18 LAB — T4, FREE: Free T4: 1.39 ng/dL (ref 0.80–1.80)

## 2014-07-18 MED ORDER — PANTOPRAZOLE SODIUM 40 MG PO TBEC
40.0000 mg | DELAYED_RELEASE_TABLET | Freq: Every day | ORAL | Status: DC
  Administered 2014-07-18 – 2014-07-23 (×6): 40 mg via ORAL
  Filled 2014-07-18 (×6): qty 1

## 2014-07-18 MED ORDER — HALOPERIDOL LACTATE 5 MG/ML IJ SOLN
1.0000 mg | INTRAMUSCULAR | Status: DC | PRN
  Administered 2014-07-18 – 2014-07-21 (×2): 1 mg via INTRAVENOUS
  Filled 2014-07-18 (×3): qty 1

## 2014-07-18 MED ORDER — HYDROMORPHONE HCL 1 MG/ML IJ SOLN
1.0000 mg | INTRAMUSCULAR | Status: DC | PRN
  Administered 2014-07-18 – 2014-07-22 (×20): 1 mg via INTRAVENOUS
  Filled 2014-07-18 (×20): qty 1

## 2014-07-18 MED ORDER — KETOROLAC TROMETHAMINE 15 MG/ML IJ SOLN
15.0000 mg | Freq: Four times a day (QID) | INTRAMUSCULAR | Status: DC | PRN
Start: 2014-07-18 — End: 2014-07-22
  Administered 2014-07-18 – 2014-07-22 (×14): 15 mg via INTRAVENOUS
  Filled 2014-07-18 (×14): qty 1

## 2014-07-18 MED ORDER — KETOROLAC TROMETHAMINE 30 MG/ML IJ SOLN
30.0000 mg | Freq: Once | INTRAMUSCULAR | Status: AC
  Administered 2014-07-18: 30 mg via INTRAVENOUS
  Filled 2014-07-18: qty 1

## 2014-07-18 MED ORDER — NICOTINE 14 MG/24HR TD PT24
14.0000 mg | MEDICATED_PATCH | Freq: Every day | TRANSDERMAL | Status: DC
  Administered 2014-07-18 – 2014-07-22 (×5): 14 mg via TRANSDERMAL
  Filled 2014-07-18 (×5): qty 1

## 2014-07-18 NOTE — Progress Notes (Signed)
Ralph Fowler, HOLBERG NO.:  [id number]  MEDICAL RECORD NO.:  [id number]  LOCATION:  2M09C                        FACILITY:  [facility]  PHYSICIAN:  Dahlia Bailiff, MD    DATE OF BIRTH:  1975/07/14                                PROGRESS NOTE   I was called to evaluate the patient for uncontrollable pain.  There is a question of seizure disorders.  So, I also had called Neurology consult.  I met the patient's at bedside at approximately 7:30.  At that time, he was able to follow commands, complaining of severe back pain. He was coherent.  He was alert and oriented x3, but he was very agitated.  On clinical exam, he has 5/5 strength in the lower extremity.  He had a negative straight leg raise test with no radicular leg pain, but reproduction of horrific back pain.  The incision site was clean, dry, and intact.  There was no drainage.  He had no headache.  No visual field disturbances.  No facial asymmetry.  Tongue was midline.  Speech was normal.  He was moving his upper extremities 5/5 strength throughout.  No shortness of breath or chest pain.  The abdomen was soft and nontender.  Major complaint was of low back pain.  While I was evaluating, he was given 2 of morphine and 5 of Valium with little to no affect.  Because of the severe pain, I elected to contact the critical care team to have him transferred to the unit so he could be placed on prosthetics.  I contemplate using Ativan or Haldol, but I think this might be a better option.  My hope is that once he is more calm and less agitated that will be able to proceed with getting a CT scan of his head as requested by the neurologist.  I also obtain a CT scan of his lumbar spine to ensure that with all the thrashing around that he has not dislodged any of the hardware.  I will continue to monitor the patient.     Dahlia Bailiff, MD     DDB/MEDQ  D:  07/17/2014  T:  07/18/2014  Job:  [id number]

## 2014-07-18 NOTE — Progress Notes (Signed)
eLink Physician-Brief Progress Note Patient Name: Ralph ConstableGary Fowler DOB: [birth year] MRN: [id number]   Date of Service  07/18/2014  HPI/Events of Note  Nicotine dependent, chews tobacco regularly  eICU Interventions  Nicotine patch     Intervention Category Minor Interventions: Routine modifications to care plan (e.g. PRN medications for pain, fever)  MCQUAID, DOUGLAS 07/18/2014, 5:42 PM

## 2014-07-18 NOTE — Procedures (Signed)
ELECTROENCEPHALOGRAM REPORT  Date of Study: 07/18/2014  Patient's Name: Ralph Fowler MRN: [id number] Date of Birth: [birth year]  Referring Provider: Dr. Elspeth ChoPeter Sumner  Clinical History: This is a 39 year old man who was lethargic and difficult to arouse post-procedure, now awake but with periods of confusion going off on tangents with nonsensical speech.   Medications: haloperidol lactate (HALDOL) injection 1 mg ketorolac (TORADOL) 15 MG/ML injection 15 mg methocarbamol (ROBAXIN) tablet 500 mg morphine 2 MG/ML injection 1-2 mg ondansetron (ZOFRAN) injection 4 mg oxyCODONE (Oxy IR/ROXICODONE) immediate release tablet 10 mg pantoprazole (PROTONIX) EC tablet 40 mg  Technical Summary: A multichannel digital EEG recording measured by the international 10-20 system with electrodes applied with paste and impedances below 5000 ohms performed in our laboratory with EKG monitoring in an predominantly drowsy and asleep patient.  Hyperventilation and photic stimulation were not performed.  The digital EEG was referentially recorded, reformatted, and digitally filtered in a variety of bipolar and referential montages for optimal display.    Description: The patient is predominantly drowsy and asleep during the recording.  During brief period of wakefulness, there is a poorly sustained symmetric, medium voltage 8 Hz posterior dominant rhythm that attenuates with eye opening.  The background consists of diffuse sharply contoured 10-12 Hz alpha activity intermixed with theta and delta slowing. Sleep architecture with vertex waves and symmetric sleep spindles seen. There were no clear epileptiform discharges or electrographic seizures seen.   EKG lead was unremarkable.  Impression: This predominantly drowsy and asleep EEG is within normal limits.   Clinical Correlation: Sharply contoured alpha activity seen may be due to sedating medication. If further clinical questions remain, a repeat wake EEG may be  helpful. Clinical correlation is advised.   Patrcia DollyKaren Deola Rewis, M.D.

## 2014-07-18 NOTE — Progress Notes (Signed)
Routine EEG completed at bedside, results pending. 

## 2014-07-18 NOTE — Care Management Note (Signed)
    Page 1 of 1   07/18/2014     8:17:27 AM CARE MANAGEMENT NOTE 07/18/2014  Patient:  Ralph Fowler,Ralph Fowler   Account Number:  [id number]  Date Initiated:  07/18/2014  Documentation initiated by:  Junius CreamerWELL,DEBBIE  Subjective/Objective Assessment:   adm w back surg, agitation     Action/Plan:   lives w wife, pcp drjefferson bulla   Anticipated DC Date:     Anticipated DC Plan:        DC Planning Services  CM consult      Choice offered to / List presented to:             Status of service:   Medicare Important Message given?   (If response is "NO", the following Medicare IM given date fields will be blank) Date Medicare IM given:   Medicare IM given by:   Date Additional Medicare IM given:   Additional Medicare IM given by:    Discharge Disposition:    Per UR Regulation:  Reviewed for med. necessity/level of care/duration of stay  If discussed at Long Length of Stay Meetings, dates discussed:    Comments:

## 2014-07-18 NOTE — Progress Notes (Signed)
eLink Physician-Brief Progress Note Patient Name: Ralph ConstableGary Fowler DOB: 09-Jun-1975 MRN: [id number]   Date of Service  07/18/2014  HPI/Events of Note  Call from nurse stating that patient is reporting off the scale pain.  He is HD stable lying in bed with adequate sats eyes closed but with elevated RR and BP.  He has received oral oxycodone and has prn morphine.  Precedex was d/ced today.   Patient is requesting dilaudid.  eICU Interventions  Plan: Morphine d/ced Dilaudid 1 mg IV q4 hours prn severe pain Continue with prn toradol     Intervention Category Intermediate Interventions: Pain - evaluation and management  [name] 07/18/2014, 9:33 PM

## 2014-07-18 NOTE — Progress Notes (Signed)
PT Cancellation Note  Patient Details Name: Venetia ConstableGary Edelstein MRN: [id number] DOB: June 15, 1975   Cancelled Treatment:    Reason Eval/Treat Not Completed: Medical issues which prohibited therapy (pt not oriented, remains agitated and at times combative, RN request hold attempting to calm pt for CT)   Delorse Lekabor, Caralyn Twining Beth 07/18/2014, 7:59 AM Delaney MeigsMaija Tabor Bevan Vu, PT [phone]

## 2014-07-18 NOTE — Progress Notes (Signed)
    Subjective: Procedure(s) (LRB): TRANSFORAMINAL LUMBAR INTERBODY FUSION (TLIF) WITH PEDICLE SCREW FIXATION 1 LEVEL L4-5 (N/A) 1 Day Post-Op  Patient reports pain as 6 on 0-10 scale.  Reports decreased leg pain reports incisional back pain   Positive void Negative bowel movement Positive flatus Negative chest pain or shortness of breath  Objective: Vital signs in last 24 hours: Temp:  [98 F (36.7 C)-99.7 F (37.6 C)] 99 F (37.2 C) (03/18 1144) Pulse Rate:  [25-147] 80 (03/18 1100) Resp:  [13-35] 27 (03/18 1100) BP: (93-158)/(58-95) 129/63 mmHg (03/18 1100) SpO2:  [90 %-100 %] 100 % (03/18 1100)  Intake/Output from previous day: 03/17 0701 - 03/18 0700 In: 3762.3 [I.V.:3262.3; IV Piggyback:500] Out: 3625 [Urine:3125; Blood:500]  Labs:  Recent Labs  07/15/14 1515 07/17/14 2141  WBC 5.9 13.0*  RBC 5.05 4.48  HCT 45.0 40.5  PLT 153 167    Recent Labs  07/15/14 1515 07/17/14 2141  NA 141 143  K 3.8 4.0  CL 107 108  CO2 26 27  BUN 8 7  CREATININE 1.10 1.26  GLUCOSE 122* 115*  CALCIUM 8.8 9.1   No results for input(s): LABPT, INR in the last 72 hours.  Physical Exam: ABD soft Neurovascular intact Dorsiflexion/Plantar flexion intact Incision: dressing C/D/I Compartment soft  Assessment/Plan: Patient stable  xrays n/a Continue mobilization with physical therapy Continue care  Patient now sleeping Agree with stopping steroids - ? Cause of MS changes Will check CT of head and L/S spine after patient's mental status improved Continue ICU care Will monitor exam  Venita Lickahari Sarinity Dicicco, MD [facility] [phone]

## 2014-07-18 NOTE — Progress Notes (Signed)
Subjective: Patients main complaint is pain in his lower back. He is oriented and able to follow all commands.   Objective: Current vital signs: BP 137/95 mmHg  Pulse 87  Temp(Src) 99.4 F (37.4 C) (Oral)  Resp 19  Ht  (1.803 m)  Wt 112.492 kg (248 lb)  BMI 34.60 kg/m2  SpO2 90% Vital signs in last 24 hours: Temp:  [98 F (36.7 C)-99.7 F (37.6 C)] 99.4 F (37.4 C) (03/18 0756) Pulse Rate:  [25-147] 87 (03/18 0830) Resp:  [13-35] 19 (03/18 0830) BP: (93-158)/(53-95) 137/95 mmHg (03/18 0830) SpO2:  [90 %-100 %] 90 % (03/18 0830)  Intake/Output from previous day: 03/17 0701 - 03/18 0700 In: 3762.3 [I.V.:3262.3; IV Piggyback:500] Out: 3625 [Urine:3125; Blood:500] Intake/Output this shift: Total I/O In: 211.2 [I.V.:101.2; IV Piggyback:110] Out: -  Nutritional status:    Neurologic Exam: Mental Status: Alert, oriented to name, date, location, thought content mostly appropriate, fluent without evidence of aphasia. Able to follow 3 step commands without difficulty. Cranial Nerves: II: optic discs not visualized, visual fields grossly normal, pupils equal, round, reactive to light  III,IV, VI: ptosis not present, extra-ocular motions intact bilaterally V,VII: smile symmetric, facial light touch sensation normal bilaterally VIII: hearing normal bilaterally IX,X: cough reflex present XI: trapezius strength/neck flexion strength normal bilaterally XII: tongue strength normal  Motor: Moves all extremities symmetrically and against light resistance Tone and bulk:normal tone throughout; no atrophy noted Sensory: light touch intact throughout, bilaterally Deep Tendon Reflexes: 2+ and symmetric throughout Plantars: Right: downgoingLeft: downgoing  Lab Results: Basic Metabolic Panel:  Recent Labs Lab 07/15/14 1515 07/17/14 2141  NA 141 143  K 3.8 4.0  CL 107 108  CO2 26 27  GLUCOSE 122* 115*  BUN 8 7  CREATININE 1.10 1.26   CALCIUM 8.8 9.1    Liver Function Tests:  Recent Labs Lab 07/17/14 1950  AST 67*  ALT 38  ALKPHOS 65  BILITOT 0.9  PROT 6.8  ALBUMIN 4.3   No results for input(s): LIPASE, AMYLASE in the last 168 hours.  Recent Labs Lab 07/17/14 1950  AMMONIA 39*    CBC:  Recent Labs Lab 07/15/14 1515 07/17/14 2141  WBC 5.9 13.0*  HGB 15.4 13.4  HCT 45.0 40.5  MCV 89.1 90.4  PLT 153 167    Cardiac Enzymes: No results for input(s): CKTOTAL, CKMB, CKMBINDEX, TROPONINI in the last 168 hours.  Lipid Panel: No results for input(s): CHOL, TRIG, HDL, CHOLHDL, VLDL, LDLCALC in the last 168 hours.  CBG:  Recent Labs Lab 07/17/14 1904 07/17/14 2117  GLUCAP 129* 110*    Microbiology: Results for orders placed or performed during the hospital encounter of 07/15/14  Surgical pcr screen     Status: None   Collection Time: 07/15/14  3:16 PM  Result Value Ref Range Status   MRSA, PCR NEGATIVE NEGATIVE Final   Staphylococcus aureus NEGATIVE NEGATIVE Final    Comment:        The Xpert SA Assay (FDA approved for NASAL specimens in patients over 15 years of age), is one component of a comprehensive surveillance program.  Test performance has been validated by [facility] for patients greater than or equal to 67 year old. It is not intended to diagnose infection nor to guide or monitor treatment.     Coagulation Studies: No results for input(s): LABPROT, INR in the last 72 hours.  Imaging: Dg Lumbar Spine 2-3 Views  07/17/2014   CLINICAL DATA:  L4-L5 TLIF.  EXAM:  DG C-ARM 61-120 MIN; LUMBAR SPINE - 2-3 VIEW  COMPARISON:  Lumbar spine MRI 10/05/2013.  FINDINGS: Posterior lumbar spinal fusion hardware demonstrated at the L4-5 level. Intervertebral disc spacer present L4-5. No acute abnormality.  IMPRESSION: Postoperative changes compatible with L4-5 fusion.   Electronically Signed   By: Annia Beltrew  Davis M.D.   On: 07/17/2014 15:28   Dg C-arm 61-120 Min  07/17/2014   CLINICAL  DATA:  L4-L5 TLIF.  EXAM: DG C-ARM 61-120 MIN; LUMBAR SPINE - 2-3 VIEW  COMPARISON:  Lumbar spine MRI 10/05/2013.  FINDINGS: Posterior lumbar spinal fusion hardware demonstrated at the L4-5 level. Intervertebral disc spacer present L4-5. No acute abnormality.  IMPRESSION: Postoperative changes compatible with L4-5 fusion.   Electronically Signed   By: Annia Beltrew  Davis M.D.   On: 07/17/2014 15:28    Medications:  Scheduled: . dexamethasone  4 mg Intravenous 4 times per day   Or  . dexamethasone  4 mg Oral 4 times per day  . sodium chloride  3 mL Intravenous Q12H    Assessment/Plan:  39y/o gentleman with history of seizure disorder presenting with chronic back pain. He is s/p L4-5 lumbar fusion this morning. Post-procedure initially noted to be lethargic and difficult to arouse.  Currently he is awake and oriented complaining of back discomfort.  Liver functions are normal and ammonia only slightly elevated. TSH 0.197.    Felicie MornDavid Smith PA-C [facility] [phone]  07/18/2014, 9:13 AM  Patient remains alert and fully oriented though he continues to have periods of confusion going off on tangents with nonsensical speech. Unclear etiology of his delerium. Ammonia slightly elevated though doubt this could explain his symptoms. CT head cancelled by CCM. Again have low suspicion for seizure but will check EEG. He continues to mainly complain of pain.   Elspeth Choeter Jarell Mcewen, DO [facility] [phone]  If 7pm- 7am, please page neurology on call as listed in AMION.

## 2014-07-18 NOTE — Progress Notes (Signed)
Agitated. Apparently in severe pain. Bizarre behavior but oriented  Filed Vitals:   07/18/14 1144 07/18/14 1200 07/18/14 1300 07/18/14 1400  BP:  123/84 113/72 114/67  Pulse:    78  Temp: 99 F (37.2 C)     TempSrc: Oral     Resp:  29 21 19   Height:      Weight:      SpO2:    100%   Crying out freq in apparent pain HEENT normal No JVD Chest clear RRR s M NABS Ext warm without edema No focal neuro deficits  I have reviewed all of today's lab results. Relevant abnormalities are discussed in the A/P section  No new CXR  IMPRESSION: S/P L spine surgery Severe post op pain without clear explanation ? delirium  Possible psych component (very odd behavior and interactions with care providers)  Neurology following and directing evaluation  PLAN/REC: DC precedex - likely of little benefit her Analgesic regimen adjusted Added PRN ketorolac  CT L spine ordered by Dr Shon BatonBrooks Transfer to SDU - PCCM to check on him 3/19 and possible s/o or ask TRH to assume medical component of care  Billy Fischeravid Simonds, MD ; [facility] Mobile [phone].  After 5:30 PM or weekends, call [phone]

## 2014-07-18 NOTE — Progress Notes (Signed)
OT Cancellation Note  Patient Details Name: Ralph Fowler MRN: [id number] DOB: 06/08/1975   Cancelled Treatment:    Reason Eval/Treat Not Completed: Patient not medically ready (pt not oriented, remains agitated and at times combative, RN request hold attempting to calm pt for CT)   Evette GeorgesLeonard, Kortney Schoenfelder Eva  [phone]  07/18/2014, 9:29 AM

## 2014-07-18 NOTE — Clinical Social Work Note (Signed)
CSW Consult Acknowledged:   CSW received a consult for SNF placement. CSW awaiting PT/OT evaluation to determine the appropriate level of care.      Dorianna Mckiver, MSW, LCSWA [phone]  

## 2014-07-18 NOTE — Op Note (Signed)
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  POSTOPERATIVE DIAGNOSIS:  Pars defect with grade 1 spondylolisthesis L4- 5 with left radicular leg pain.  OPERATIVE PROCEDURES:  Gill decompression L4-5 left side with decompression of the 4 and 5 nerve roots, complete diskectomy L4-5 with insertion of biomechanical intervertebral device, posterolateral arthrodesis L4-5 with local bone and Conform autograft sheet, posterior segmental instrumentation with pedicle screw fixation L4-5.  COMPLICATIONS:  None.  CONDITION:  Stable.  HISTORY:  This is a very pleasant 39 year old gentleman who has been having progressive back, buttock, and radicular leg pain for approximately a year now.  The patient has had injection therapy, physiotherapy, modified duties, and narcotic and nonnarcotic medications, and has continued to deteriorate.  As a result, we elected to proceed with surgery.  All appropriate risks, benefits, and alternatives were discussed with the patient and consent was obtained.  OPERATIVE NOTE:  The patient was brought to the operating room, placed supine on the operating table.  After successful induction of general anesthesia and endotracheal intubation, TEDs, SCDs, and Foley were inserted.  Intraoperative needles were placed and intraoperative EMG neuromonitoring.  Once they were secured, he was turned prone onto the Wilson frame.  All bony prominences were well padded.  The back was prepped and draped in a standard fashion.  Time-out was taken to confirm patient, procedure, and all other pertinent important  data.  Once this was completed, we then tested to ensure that the patient had all his twitches which he did.  At this point, on the right-hand side, I identified the lateral borders of the L4 and L5 pedicles and repeated this on the contralateral side.  I infiltrated the proposed incision sites for standard MIFT lift approach.  On the right-hand side, I made a small longitudinal incision down to the deep fascia.  I advanced the Jamshidi needle percutaneously to the lateral aspect of the facet complex.  I identified the appropriate starting position in both planes and then advanced the Jamshidi needle while stimulating into the disk space and then into the vertebral body.  The patient had a large pedicle and this was easily placed.  I repeated this procedure at L5.  I then tapped and elected to place a 7.5 diameter 45 mm screw over the guide pins at L4-L5 on the right-hand side.  I then stimulated both screws and they both had no evidence of neurodiagnostically of pedicle breach. With this complete, I then went to the left-hand side of the body.  I made an incision which was a standard Wiltse approach about 2-3 fingers from the midline.  I then sharply dissected through the adipose tissue to the deep fascia.  Deep fascia was sharply incised and I bluntly dissected through the paravertebral musculature until  I could palpate the facet complex.  I then placed the Jamshidi needle on the lateral aspect of the facet complex and confirmed position with AP and lateral fluoro and advanced the Jamshidi needle through the pedicle and into the vertebral body.  This was repeated at L4.  Once both the Jamshidi needles were advanced, then I cannulated both pedicles, tapped and placed a 50 mm length screw at L4, 7.5 diameter.  L5, at this level, I was concerned that I had that lateral, so I elected to use a smaller pedicle screw.  I used a 6.5 mm diameter and 45 mm length screw.  All screws had good  purchase.  I stimulated these 2, left once in, and they had adequate stem values indicating there was no breach.  With all 4 pedicle screws in place, I attached the retracting device to the pedicles of L4-5 on the left side and exposed the posterolateral aspect of the spine.  I used a bipolar Bovie to remove the facet complex at L4- 5 and then used an osteotome to remove the entire inferior L4 facet complex.  I then used a 3 mm Kerrison to perform a generous laminotomy of L4.  I then used Penfield 4 to dissect through the ligamentum flavum. I then used my 2 and 3 mm Kerrison to remove the ligamentum flavum.  The patient had very large epidural veins which required coagulation with bipolar electrocautery.  I then resected the remaining portion of the pars using a 3 mm Kerrison rongeur.  I could now visualize the exiting 4 nerve root and traversing 5 nerve root.  I was able to palpate the medial and inferior aspect of the L4 pedicle and the superior and medial aspect of the L5 pedicle.  I then visualized the posterolateral corner of the disk at L4-5.  Neuro patties were placed to protect the nerve roots.  I placed a retractor to protect the thecal sac and then incised the disk space.  I then used a combination of pituitary rongeurs, side- cutting curettes, straight curettes, and various pituitary rongeurs to remove all the disk material.  I was able to scrape the endplates of 4 and 5 and rasped.  Once I confirmed I had an adequate diskectomy and resected and I had bleeding endplates, I then measured the intervertebral space.  I then took the size 11 extra long Titan Titanium TLIF cage, packed it with the local bone that I had harvested from the decompression and mixed it with DBX.  I then malleted the cage into place after irrigating the wound.  It should be noted that prior to placing the cage, I did take a portion of my Conform autograft sheet and placed it along the anterior aspect of the  annulus.  I then placed the interbody cage and malleted it to the appropriate depth.  I then used the bone tamps to place it into a horizontal position in the disk space about the junction in the anterior third.  At this point, I was very pleased with the position of the cage.  I then removed the kyphosis out of the bed in order to allow some better restoration of the sagittal alignment.  Once this was done, I then applied the polyaxial heads to the pedicle screws.  I irrigated the wound copiously with normal saline and then made sure I had hemostasis using bipolar electrocautery.  Once this was done, I then obtained a rod, placed it across the pedicle screws, and then  locked it with the top nuts according to manufacturer's standards.  All screws were then torqued off, the locking caps were torqued appropriately.  Once they were locked on this side, I removed the entire retracting device.  I irrigated the wound copiously with normal saline and then made sure I had hemostasis using bipolar electrocautery.  Once I confirmed my hemostasis, I placed a thrombin- soaked Gelfoam patty over the exposed thecal sac.  At this point with the left side complete, I closed the deep fascia with interrupted #1 Vicryl sutures, superficial with 2-0 Vicryl sutures, and 3-0 Monocryl for the skin.  I then went to the contralateral side and used a curette to disrupt the L4-5 facet complex.  I then placed the remaining portion of autograft Conform in the posterolateral gutter along with the local bone graft, allograft, and autograft.  Once this was done, I measured a rod, placed it, and then used a locking cap to secured it in place. They were then torqued off according to the manufacturer's standards.  I removed insertion device.  I took final x-rays.  I was satisfied with the AP and lateral x-rays, the cage and screws were properly positioned. I then irrigated these 2 small stab incisions and then closed  with interrupted 2-0 Vicryl sutures and 3-0 Monocryl.  Steri-Strips and dry dressing were applied.  The patient was ultimately extubated, transferred to PACU without incident.  At the end of the case, all needle and sponge counts were correct.  There were no adverse anesthetic events.  Blood loss was approximately 500 mL, and there were no abnormal EMG findings.     Alvy Beal, MD     DDB/MEDQ  D:  07/17/2014  T:  07/18/2014  Job:  [id number]

## 2014-07-19 DIAGNOSIS — R404 Transient alteration of awareness: Secondary | ICD-10-CM

## 2014-07-19 LAB — BASIC METABOLIC PANEL
Anion gap: 6 (ref 5–15)
BUN: 11 mg/dL (ref 6–23)
CO2: 25 mmol/L (ref 19–32)
Calcium: 8.1 mg/dL — ABNORMAL LOW (ref 8.4–10.5)
Chloride: 108 mmol/L (ref 96–112)
Creatinine, Ser: 0.99 mg/dL (ref 0.50–1.35)
GLUCOSE: 100 mg/dL — AB (ref 70–99)
Potassium: 3.4 mmol/L — ABNORMAL LOW (ref 3.5–5.1)
Sodium: 139 mmol/L (ref 135–145)

## 2014-07-19 LAB — CBC
HCT: 33 % — ABNORMAL LOW (ref 39.0–52.0)
Hemoglobin: 11 g/dL — ABNORMAL LOW (ref 13.0–17.0)
MCH: 29.6 pg (ref 26.0–34.0)
MCHC: 33.3 g/dL (ref 30.0–36.0)
MCV: 88.9 fL (ref 78.0–100.0)
Platelets: 135 10*3/uL — ABNORMAL LOW (ref 150–400)
RBC: 3.71 MIL/uL — AB (ref 4.22–5.81)
RDW: 13.3 % (ref 11.5–15.5)
WBC: 11.2 10*3/uL — ABNORMAL HIGH (ref 4.0–10.5)

## 2014-07-19 NOTE — Progress Notes (Signed)
Subjective: Patients main complaint is pain. Per RN mental status has been stable overnight. Agitation appears mainly relatd to pain.   EEG completed and overall unremarkable. No signs of seizure activity.   Objective: Current vital signs: BP 118/60 mmHg  Pulse 94  Temp(Src) 98.5 F (36.9 C) (Oral)  Resp 21  Ht  (1.803 m)  Wt 112.492 kg (248 lb)  BMI 34.60 kg/m2  SpO2 96% Vital signs in last 24 hours: Temp:  [98.5 F (36.9 C)-100.8 F (38.2 C)] 98.5 F (36.9 C) (03/19 0744) Pulse Rate:  [62-111] 94 (03/19 0800) Resp:  [13-29] 21 (03/19 0800) BP: (100-141)/(44-84) 118/60 mmHg (03/19 0800) SpO2:  [93 %-100 %] 96 % (03/19 0800)  Intake/Output from previous day: 03/18 0701 - 03/19 0700 In: 1917.4 [P.O.:1440; I.V.:312.4; IV Piggyback:165] Out: 1650 [Urine:1650] Intake/Output this shift:   Nutritional status: Diet heart healthy/carb modified  Neurologic Exam: Mental Status: Alert, oriented to name, date, location, thought content appropriate, fluent without evidence of aphasia. Able to follow 3 step commands without difficulty. Cranial Nerves: II: optic discs not visualized, visual fields grossly normal, pupils equal, round, reactive to light  III,IV, VI: ptosis not present, extra-ocular motions intact bilaterally V,VII: smile symmetric, facial light touch sensation normal bilaterally Motor: Moves all extremities symmetrically and against light resistance Tone and bulk:normal tone throughout; no atrophy noted Sensory: light touch intact throughout, bilaterally Deep Tendon Reflexes: 2+ and symmetric throughout Plantars: Right: downgoingLeft: downgoing  Lab Results: Basic Metabolic Panel:  Recent Labs Lab 07/15/14 1515 07/17/14 2141 07/19/14 0239  NA 141 143 139  K 3.8 4.0 3.4*  CL 107 108 108  CO2 GLUCOSE 122* 115* 100*  BUN CREATININE 1.10 1.26 0.99  CALCIUM 8.8 9.1 8.1*    Liver Function  Tests:  Recent Labs Lab 07/17/14 1950  AST 67*  ALT 38  ALKPHOS 65  BILITOT 0.9  PROT 6.8  ALBUMIN 4.3   No results for input(s): LIPASE, AMYLASE in the last 168 hours.  Recent Labs Lab 07/17/14 1950  AMMONIA 39*    CBC:  Recent Labs Lab 07/15/14 1515 07/17/14 2141 07/19/14 0239  WBC 5.9 13.0* 11.2*  HGB 15.4 13.4 11.0*  HCT 45.0 40.5 33.0*  MCV 89.1 90.4 88.9  PLT 153 167 135*    Cardiac Enzymes: No results for input(s): CKTOTAL, CKMB, CKMBINDEX, TROPONINI in the last 168 hours.  Lipid Panel: No results for input(s): CHOL, TRIG, HDL, CHOLHDL, VLDL, LDLCALC in the last 168 hours.  CBG:  Recent Labs Lab 07/17/14 1904 07/17/14 2117  GLUCAP 129* 110*    Microbiology: Results for orders placed or performed during the hospital encounter of 07/15/14  Surgical pcr screen     Status: None   Collection Time: 07/15/14  3:16 PM  Result Value Ref Range Status   MRSA, PCR NEGATIVE NEGATIVE Final   Staphylococcus aureus NEGATIVE NEGATIVE Final    Comment:        The Xpert SA Assay (FDA approved for NASAL specimens in patients over 40 years of age), is one component of a comprehensive surveillance program.  Test performance has been validated by [facility] for patients greater than or equal to 40 year old. It is not intended to diagnose infection nor to guide or monitor treatment.     Coagulation Studies: No results for input(s): LABPROT, INR in the last 72 hours.  Imaging: Dg Lumbar Spine 2-3 Views  07/18/2014   CLINICAL DATA:  S/P  spinal fusion yesterday. Pt experiencing severe lumbar pain.  EXAM: LUMBAR SPINE - 2-3 VIEW  COMPARISON:  Lumbar CT, 07/18/2014 at 17:08 hours. Operative lumbar spine images from the posterior fusion performed yesterday.  FINDINGS: Bilateral pedicle screws at L4 and L5 and associated interconnecting rods are well-seated and aligned. Metallic disc spacer is well positioned centrally in the L4-L5 disc space maintaining disc  height.  No acute fracture. The fracture adjacent to the pedicle screw at L4 on the right, seen on the current lumbar CT, is not visualized on standard radiographs.  Mild anterolisthesis of L4 on L5 stable from the operative radiographs as is the position of the orthopedic hardware.  Soft tissues are unremarkable.  IMPRESSION: Well-seated and well-aligned orthopedic hardware following L4-L5 posterior lumbar spine fusion. The appearance of the orthopedic hardware and alignment of the lower lumbar spine is stable from the operative images.   Electronically Signed   By: Amie Portlandavid  Ormond M.D.   On: 07/18/2014 17:59   Dg Lumbar Spine 2-3 Views  07/17/2014   CLINICAL DATA:  L4-L5 TLIF.  EXAM: DG C-ARM 61-120 MIN; LUMBAR SPINE - 2-3 VIEW  COMPARISON:  Lumbar spine MRI 10/05/2013.  FINDINGS: Posterior lumbar spinal fusion hardware demonstrated at the L4-5 level. Intervertebral disc spacer present L4-5. No acute abnormality.  IMPRESSION: Postoperative changes compatible with L4-5 fusion.   Electronically Signed   By: Annia Beltrew  Davis M.D.   On: 07/17/2014 15:28   Ct Lumbar Spine Wo Contrast  07/18/2014   CLINICAL DATA:  [age] year L male back pain. Left lower extremity pain lumbar surgery yesterday.  EXAM: CT LUMBAR SPINE WITHOUT CONTRAST  TECHNIQUE: Multidetector CT imaging of the lumbar spine was performed without intravenous contrast administration. Multiplanar CT image reconstructions were also generated.  COMPARISON:  MRI lumbar spine 10/05/2013.  FINDINGS: The patient is status post L4 discectomy and left laminectomy. Pedicle screw and rod fixation is present at L4-5. Slight anterolisthesis is stable. 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 There is sclerosis a cleft are scratch the there is sclerosis across pars defect on the right. A minimally displaced fracture is present posteriorly at screw entry site on the right at L4. The pedicle is intact. There is no significant lucency associated with either scratch the  there is no significant lucency associated with any of the pedicle screws.  Fluid and gas are present at the laminectomy site as expected. No focal stenosis is evident.  Limited imaging of the abdomen is unremarkable. No significant disc protrusion or stenosis is present.  IMPRESSION: 1. L4 discectomy was satisfactory positioning of the disc spacer. 2. Left laminectomy at L4. 3. Minimal nondisplaced fracture of the screw entry site on the right at L4. Pedicles intact.   Electronically Signed   By: Marin Robertshristopher  Mattern M.D.   On: 07/18/2014 17:38   Dg C-arm 61-120 Min  07/17/2014   CLINICAL DATA:  L4-L5 TLIF.  EXAM: DG C-ARM 61-120 MIN; LUMBAR SPINE - 2-3 VIEW  COMPARISON:  Lumbar spine MRI 10/05/2013.  FINDINGS: Posterior lumbar spinal fusion hardware demonstrated at the L4-5 level. Intervertebral disc spacer present L4-5. No acute abnormality.  IMPRESSION: Postoperative changes compatible with L4-5 fusion.   Electronically Signed   By: Annia Beltrew  Davis M.D.   On: 07/17/2014 15:28    Medications:  Scheduled: . nicotine  14 mg Transdermal Daily  . pantoprazole  40 mg Oral Q1200  . sodium chloride  3 mL Intravenous Q12H    Assessment/Plan:  39y/o gentleman with history of seizure  disorder presenting with chronic back pain. He is s/p L4-5 lumbar fusion this morning. Patient remains alert and fully oriented. His main concern at this time is reported severe refractory pain. Unclear source of pain. CT L spine overall unremarkable with minimal nondisplaced fracture of the screw entry site on right L4.    Mental status appears overall stable, current periods of agitation appear related to pain. Question if psych component. No further neurological workup indicated at this time.   Elspeth Cho, DO [facility] [phone]  If 7pm- 7am, please page neurology on call as listed in AMION.

## 2014-07-19 NOTE — Evaluation (Signed)
Physical Therapy Evaluation Patient Details Name: Ralph Fowler MRN: [id number] DOB: 09-17-1975 Today's Date: 07/19/2014   History of Present Illness  39 y.o. male s/p TRANSFORAMINAL LUMBAR INTERBODY FUSION (TLIF) WITH PEDICLE SCREW FIXATION 1 LEVEL L4-5,  he developed severe agitation and back pain which was refractory to narcotic pain treatment and was transferred to ICU.  Clinical Impression  Patient is s/p above surgery and complications presenting with functional limitations due to the deficits listed below (see PT Problem List). Spent time with patient reviewing back precautions, use of spinal corset, and practicing safe mobility techniques. Requires min-max assist +2 for safe bed mobility and transfers. Reports improvement in pain upon sitting edge of bed and standing however demonstrates significant LE weakness upon standing and transition to sit with notable buckling. Unable to tolerate any manual muscle testing of LEs due to back pain while in supine however, reports numbness of toes is greatly improved. Patient may benefit from CIR prior to returning home to maximize safety and functional abilities. Patient will benefit from skilled PT to increase their independence and safety with mobility to allow discharge to the venue listed below.       Follow Up Recommendations CIR    Equipment Recommendations   (TBD)    Recommendations for Other Services Rehab consult;OT consult     Precautions / Restrictions Precautions Precautions: Back;Fall Precaution Booklet Issued: Yes (comment) Precaution Comments: reviewed precautions Required Braces or Orthoses: Spinal Brace Spinal Brace: Lumbar corset Restrictions Weight Bearing Restrictions: No      Mobility  Bed Mobility Overal bed mobility: Needs Assistance;+2 for physical assistance Bed Mobility: Rolling;Sidelying to Sit;Sit to Sidelying Rolling: Min assist Sidelying to sit: Max assist;+2 for physical assistance     Sit to sidelying:  Min assist General bed mobility comments: Educated on safe log roll technique with cues to maintain neutral spinal alignment. Min assist for rolling to Pts left side, reports pain in Lt shoulder due to past surgery. Max assist for truncal support into seated position +2. Min assist for trunk support to return to sidelying position with +2 support for LEs. Requires extra time and heavy use of rail with this task.  Transfers Overall transfer level: Needs assistance Equipment used: Rolling walker (2 wheeled) Transfers: Sit to/from Stand Sit to Stand: Mod assist;+2 physical assistance;From elevated surface         General transfer comment: Mod assist +2 for boost to stand from elevated bed surface. Cues for hand placement and to maintain neutral spinal alignment, encouraging increased use of LEs. Once standing relies heavily on RW for support but gradually increased WB through LEs. Mod assist to sit back on bed safely with notable buckling of BIL LEs. Attempted second stand after seated rest break however pts LE strength insufficent to rise on this second attempt.  Ambulation/Gait                Stairs            Wheelchair Mobility    Modified Rankin (Stroke Patients Only)       Balance Overall balance assessment: Needs assistance;History of Falls Sitting-balance support: Single extremity supported;Feet supported Sitting balance-Leahy Scale: Poor     Standing balance support: Bilateral upper extremity supported Standing balance-Leahy Scale: Poor                               Pertinent Vitals/Pain Pain Assessment: 0-10 Pain Score: 9  Pain Location: back  Pain Descriptors / Indicators: Sharp Pain Intervention(s): Limited activity within patient's tolerance;Repositioned;Monitored during session  HR elevated to 125 with mobility. 108 bpm at rest SpO2 95% on room air BP 127/79    Home Living Family/patient expects to be discharged to:: Private  residence Living Arrangements: Spouse/significant other;Children (children 2812 and 37 y.o.) Available Help at Discharge: Family (wife works) Type of Home: Dillard'sHouse Home Access: Stairs to enter Entrance Stairs-Rails: None Secretary/administratorntrance Stairs-Number of Steps: 1 Home Layout: One level Home Equipment: Environmental consultantWalker - 2 wheels;Crutches      Prior Function Level of Independence: Independent with assistive device(s)         Comments: crutches used when back was problematic     Hand Dominance   Dominant Hand: Right    Extremity/Trunk Assessment   Upper Extremity Assessment: Defer to OT evaluation           Lower Extremity Assessment: Generalized weakness         Communication   Communication: No difficulties  Cognition Arousal/Alertness: Awake/alert Behavior During Therapy: WFL for tasks assessed/performed Overall Cognitive Status: Within Functional Limits for tasks assessed                      General Comments General comments (skin integrity, edema, etc.): Reviewed back precautions, safety with mobility, including use of log roll technique for all bed mobility. Practiced donning/doffing spinal corset, required assist for this task. Pt reports his pain improves sitting edge of bed and standing compared to lying supine in bed.    Exercises General Exercises - Lower Extremity Ankle Circles/Pumps: AROM;Both;10 reps;Supine      Assessment/Plan    PT Assessment Patient needs continued PT services  PT Diagnosis Difficulty walking;Generalized weakness;Acute pain   PT Problem List Decreased strength;Decreased range of motion;Decreased activity tolerance;Decreased balance;Decreased mobility;Decreased knowledge of use of DME;Decreased knowledge of precautions;Pain;Obesity  PT Treatment Interventions DME instruction;Gait training;Stair training;Functional mobility training;Therapeutic activities;Therapeutic exercise;Balance training;Neuromuscular re-education;Patient/family  education;Modalities   PT Goals (Current goals can be found in the Care Plan section) Acute Rehab PT Goals Patient Stated Goal: Go home PT Goal Formulation: With patient Time For Goal Achievement: 07/26/14 Potential to Achieve Goals: Good    Frequency Min 5X/week   Barriers to discharge Decreased caregiver support wife works    Co-evaluation               End of Session Equipment Utilized During Treatment: Gait belt;Back brace Activity Tolerance: Patient limited by pain;Patient limited by fatigue Patient left: in bed;with call bell/phone within reach Nurse Communication: Mobility status;Precautions         Time: [id number] PT Time Calculation (min) (ACUTE ONLY): 39 min   Charges:   PT Evaluation $Initial PT Evaluation Tier I: 1 Procedure PT Treatments $Therapeutic Activity: 8-22 mins $Self Care/Home Management: 8-22   PT G Codes:        [place], [place]ia Adelsberger S 07/19/2014, 4:51 PM Sunday SpillersLogan Secor Far[place], [place] [phone]

## 2014-07-19 NOTE — Progress Notes (Signed)
PULMONARY / CRITICAL CARE MEDICINE   Name: Ralph Fowler MRN: [id number] DOB: [birth year]    ADMISSION DATE:  07/17/2014 CONSULTATION DATE:  07/17/2014  REFERRING MD :  D. Brooks  CHIEF COMPLAINT:  Agitation  INITIAL PRESENTATION: 39 year old male presented to [facility] for L spine procedure 3/17 without complication. In PACU he was slow to wake up. Once on floor he developed severe agitation and back pain which was refractory to narcotic pain treatment. PCCM to consult.  STUDIES:  EEG 3/18>>drowsy but normal EEG   SIGNIFICANT EVENTS: 3/17 TRANSFORAMINAL LUMBAR INTERBODY FUSION (TLIF) WITH PEDICLE SCREW FIXATION 1 LEVEL L4-5 (N/A) 3/18 CT L spine>> 1. L4 discectomy was satisfactory positioning of the disc spacer. 2. Left laminectomy at L4. 3. Minimal nondisplaced fracture of the screw entry site on the right at L4. Pedicles intact.  SUBJECTIVE:  Cont c/o pain.  Agitation resolved.    VITAL SIGNS: Temp:  [98.5 F (36.9 C)-100.8 F (38.2 C)] 98.5 F (36.9 C) (03/19 0744) Pulse Rate:  [78-111] 91 (03/19 1100) Resp:  [13-29] 22 (03/19 1100) BP: (107-141)/(44-84) 113/66 mmHg (03/19 1100) SpO2:  [92 %-100 %] 97 % (03/19 1100)    INTAKE / OUTPUT:  Intake/Output Summary (Last 24 hours) at 07/19/14 1123 Last data filed at 07/19/14 0900  Gross per 24 hour  Intake   1618 ml  Output   1650 ml  Net    -32 ml    PHYSICAL EXAMINATION: General:  Obese male in NAD at rest Neuro:  Awake, alert, appropriate, MAE, significant pain with movement  HEENT:  [place]/AT, PERRL, no JVD Cardiovascular:  Tachy, regular, no MRG Lungs:  Clear bilateral breath sounds Abdomen:  Soft, non-tender, non-distended Musculoskeletal:  No acute deformity or ROM limitation Skin:  Grossly intact  LABS:  CBC  Recent Labs Lab 07/15/14 1515 07/17/14 2141 07/19/14 0239  WBC 5.9 13.0* 11.2*  HGB 15.4 13.4 11.0*  HCT 45.0 40.5 33.0*  PLT 153 167 135*   Coag's No results for input(s): APTT, INR in the last 168  hours. BMET  Recent Labs Lab 07/15/14 1515 07/17/14 2141 07/19/14 0239  NA 141 143 139  K 3.8 4.0 3.4*  CL 107 108 108  CO2 BUN CREATININE 1.10 1.26 0.99  GLUCOSE 122* 115* 100*   Electrolytes  Recent Labs Lab 07/15/14 1515 07/17/14 2141 07/19/14 0239  CALCIUM 8.8 9.1 8.1*   Sepsis Markers No results for input(s): LATICACIDVEN, PROCALCITON, O2SATVEN in the last 168 hours. ABG  Recent Labs Lab 07/17/14 2330  PHART 7.421  PCO2ART 35.9  PO2ART 63.0*   Liver Enzymes  Recent Labs Lab 07/17/14 1950  AST 67*  ALT 38  ALKPHOS 65  BILITOT 0.9  ALBUMIN 4.3   Cardiac Enzymes No results for input(s): TROPONINI, PROBNP in the last 168 hours. Glucose  Recent Labs Lab 07/17/14 1904 07/17/14 2117  GLUCAP 129* 110*    Imaging Dg Lumbar Spine 2-3 Views  07/18/2014   CLINICAL DATA:  S/P spinal fusion yesterday. Pt experiencing severe lumbar pain.  EXAM: LUMBAR SPINE - 2-3 VIEW  COMPARISON:  Lumbar CT, 07/18/2014 at 17:08 hours. Operative lumbar spine images from the posterior fusion performed yesterday.  FINDINGS: Bilateral pedicle screws at L4 and L5 and associated interconnecting rods are well-seated and aligned. Metallic disc spacer is well positioned centrally in the L4-L5 disc space maintaining disc height.  No acute fracture. The fracture adjacent to the pedicle screw at L4 on the  right, seen on the current lumbar CT, is not visualized on standard radiographs.  Mild anterolisthesis of L4 on L5 stable from the operative radiographs as is the position of the orthopedic hardware.  Soft tissues are unremarkable.  IMPRESSION: Well-seated and well-aligned orthopedic hardware following L4-L5 posterior lumbar spine fusion. The appearance of the orthopedic hardware and alignment of the lower lumbar spine is stable from the operative images.   Electronically Signed   By: Amie Portlandavid  Ormond M.D.   On: 07/18/2014 17:59   Ct Lumbar Spine Wo Contrast  07/18/2014    CLINICAL DATA:  [age] year L male back pain. Left lower extremity pain lumbar surgery yesterday.  EXAM: CT LUMBAR SPINE WITHOUT CONTRAST  TECHNIQUE: Multidetector CT imaging of the lumbar spine was performed without intravenous contrast administration. Multiplanar CT image reconstructions were also generated.  COMPARISON:  MRI lumbar spine 10/05/2013.  FINDINGS: The patient is status post L4 discectomy and left laminectomy. Pedicle screw and rod fixation is present at L4-5. Slight anterolisthesis is stable. 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 There is sclerosis a cleft are scratch the there is sclerosis across pars defect on the right. A minimally displaced fracture is present posteriorly at screw entry site on the right at L4. The pedicle is intact. There is no significant lucency associated with either scratch the there is no significant lucency associated with any of the pedicle screws.  Fluid and gas are present at the laminectomy site as expected. No focal stenosis is evident.  Limited imaging of the abdomen is unremarkable. No significant disc protrusion or stenosis is present.  IMPRESSION: 1. L4 discectomy was satisfactory positioning of the disc spacer. 2. Left laminectomy at L4. 3. Minimal nondisplaced fracture of the screw entry site on the right at L4. Pedicles intact.   Electronically Signed   By: Marin Robertshristopher  Mattern M.D.   On: 07/18/2014 17:38     ASSESSMENT / PLAN: IMPRESSION: S/P L spine surgery Severe post op pain without clear explanation ? Delirium - appears resolved.  Possible psych component (very odd behavior and interactions with care providers) Neurology following and directing evaluation - EEG neg. CT L spine overall unremarkable with minimal nondisplaced fracture of the screw entry site on right L4.   PLAN/REC: Cont analgesia regimen  No further neuro w/u indicated per neurology  Remains off precedex  Analgesic regimen adjusted PRN  ketorolac  F/u chem  Tx SDU   PCCM signing off, please call back if needed     Dirk DressKaty Whiteheart, NP 07/19/2014  11:23 AM Pager: [phone] or [phone]  Attending Note:  I have examined patient, reviewed labs, studies and notes. I have discussed the case with Jasper RilingK Whiteheart, and I agree with the data and plans as amended above.  He is in pain, but better controlled. No longer delirious. Should move to SDU.   Levy Pupaobert Enna Warwick, MD, PhD 07/19/2014, 4:12 PM [facility] [phone] or if no answer [phone]

## 2014-07-20 NOTE — Progress Notes (Signed)
2nd attempt to call report to 5N RN- will attempt in 10 min

## 2014-07-20 NOTE — Progress Notes (Signed)
Attempted report to 5N RN- will try again in 1 0 min

## 2014-07-20 NOTE — Progress Notes (Addendum)
Subjective: 3 Days Post-Op Procedure(s) (LRB): TRANSFORAMINAL LUMBAR INTERBODY FUSION (TLIF) WITH PEDICLE SCREW FIXATION 1 LEVEL L4-5 (N/A) Patient reports pain as moderate to lower back and legs. Pain control is his primary compliant at this time.  Reports weakness in legs. Weak with PT yesterday. Tolerating po's. Denies Sob, CP, N/V.   Objective: Vital signs in last 24 hours: Temp:  [99.1 F (37.3 C)-101.2 F (38.4 C)] 99.6 F (37.6 C) (03/20 0354) Pulse Rate:  [79-105] 87 (03/20 1000) Resp:  [13-25] 17 (03/20 1000) BP: (108-145)/(58-101) 127/79 mmHg (03/20 1000) SpO2:  [91 %-100 %] 96 % (03/20 1000)  Intake/Output from previous day: 03/19 0701 - 03/20 0700 In: 360 [P.O.:360] Out: 800 [Urine:800] Intake/Output this shift:     Recent Labs  07/17/14 2141 07/19/14 0239  HGB 13.4 11.0*    Recent Labs  07/17/14 2141 07/19/14 0239  WBC 13.0* 11.2*  RBC 4.48 3.71*  HCT 40.5 33.0*  PLT 167 135*    Recent Labs  07/17/14 2141 07/19/14 0239  NA 143 139  K 4.0 3.4*  CL 108 108  CO2 27 25  BUN 7 11  CREATININE 1.26 0.99  GLUCOSE 115* 100*  CALCIUM 9.1 8.1*   No results for input(s): LABPT, INR in the last 72 hours.  Alert and oriented x3. Follow commands. RRR, Lungs clear, BS x4. Left Calf soft and non tender. Lumbar dressing C/D/I. No DVT signs. No signs of infection or compartment syndrome. LLE neurovascularly intact.   Assessment/Plan: 3 Days Post-Op Procedure(s) (LRB): TRANSFORAMINAL LUMBAR INTERBODY FUSION (TLIF) WITH PEDICLE SCREW FIXATION 1 LEVEL L4-5 (N/A) Up with PT Transfer to floor Continue current care Monitor agitation.  Wife Amy updated via phone about plan. Questions encouraged and answered in detail.   Cece Milhouse L 07/20/2014, 11:08 AM

## 2014-07-21 ENCOUNTER — Encounter (HOSPITAL_COMMUNITY): Payer: Self-pay | Admitting: General Practice

## 2014-07-21 NOTE — Progress Notes (Signed)
Physical Therapy Treatment Patient Details Name: Ralph Fowler MRN: [id number] DOB: 06/26/1975 Today's Date: 07/21/2014    History of Present Illness 39 y.o. male s/p TRANSFORAMINAL LUMBAR INTERBODY FUSION (TLIF) WITH PEDICLE SCREW FIXATION 1 LEVEL L4-5,  he developed severe agitation and back pain which was refractory to narcotic pain treatment and was transferred to ICU.    PT Comments    Patient progressing well with mobility. Tolerated mobility with Min guard-supervision for safety. Discharge recommendation updated to home with supervision and no further follow up based on improvement in functional mobility today. Will focus on bed mobility and transfers next session to simulate home. Will continue to follow per current POC.   Follow Up Recommendations  No PT follow up;Supervision - Intermittent     Equipment Recommendations  Rolling walker with 5" wheels    Recommendations for Other Services       Precautions / Restrictions Precautions Precautions: Back;Fall Precaution Comments: reviewed precautions. pt able to verbalize 3/3 back precautions with cues.  Required Braces or Orthoses: Spinal Brace Spinal Brace: Lumbar corset Restrictions Weight Bearing Restrictions: No    Mobility  Bed Mobility Overal bed mobility: Needs Assistance Bed Mobility: Rolling;Sidelying to Sit Rolling: Min guard Sidelying to sit: Min guard       General bed mobility comments: HOB flat, heavy use of rails for support. Good demo of log roll technique. Increased time and effort.   Transfers Overall transfer level: Needs assistance Equipment used: Rolling walker (2 wheeled) Transfers: Sit to/from Stand Sit to Stand: Min guard;From elevated surface         General transfer comment: Min guard to rise from EOB (pt has high bed at home). Cues for hand placement.   Ambulation/Gait Ambulation/Gait assistance: Supervision Ambulation Distance (Feet): 510 Feet Assistive device: Rolling walker (2  wheeled) Gait Pattern/deviations: Step-through pattern;Decreased stride length;Trunk flexed   Gait velocity interpretation: Below normal speed for age/gender General Gait Details: Cues for upright posture. Decreased foot clearance LLE initially - improved with increased distance.    Stairs            Wheelchair Mobility    Modified Rankin (Stroke Patients Only)       Balance Overall balance assessment: Needs assistance Sitting-balance support: Feet supported;No upper extremity supported Sitting balance-Leahy Scale: Good Sitting balance - Comments: Able to donn brace in sitting with setup. Able to donn socks sitting EOB with increased time.   Standing balance support: During functional activity Standing balance-Leahy Scale: Poor Standing balance comment: Relient on RW for support.                    Cognition Arousal/Alertness: Awake/alert Behavior During Therapy: WFL for tasks assessed/performed Overall Cognitive Status: Within Functional Limits for tasks assessed                      Exercises      General Comments        Pertinent Vitals/Pain Pain Assessment: Faces Faces Pain Scale: Hurts even more Pain Location: back with movement Pain Descriptors / Indicators: Sore;Aching Pain Intervention(s): Monitored during session;Repositioned;Premedicated before session    Home Living                      Prior Function            PT Goals (current goals can now be found in the care plan section) Progress towards PT goals: Progressing toward goals    Frequency  Min 5X/week    PT Plan Discharge plan needs to be updated    Co-evaluation PT/OT/SLP Co-Evaluation/Treatment: Yes Reason for Co-Treatment: For patient/therapist safety PT goals addressed during session: Mobility/safety with mobility       End of Session Equipment Utilized During Treatment: Gait belt;Back brace Activity Tolerance: Patient tolerated treatment  well Patient left: in chair;with call bell/phone within reach;with family/visitor present     Time: 7846-9629 PT Time Calculation (min) (ACUTE ONLY): 25 min  Charges:  $Gait Training: 8-22 mins                    G CodesAlvie Heidelberg A 08-01-2014, 2:15 PM  Alvie Heidelberg, PT, DPT [phone]

## 2014-07-21 NOTE — Progress Notes (Signed)
    Subjective: Procedure(s) (LRB): TRANSFORAMINAL LUMBAR INTERBODY FUSION (TLIF) WITH PEDICLE SCREW FIXATION 1 LEVEL L4-5 (N/A) 4 Days Post-Op  Patient reports pain as 4 on 0-10 scale.  Reports decreased leg pain reports incisional back pain   Positive void Positive bowel movement Positive flatus Negative chest pain or shortness of breath  Objective: Vital signs in last 24 hours: Temp:  [98.7 F (37.1 C)-99.2 F (37.3 C)] 98.7 F (37.1 C) (03/21 1355) Pulse Rate:  [81-102] 102 (03/21 1355) Resp:  [18-20] 20 (03/21 1355) BP: (127-136)/(77-89) 132/89 mmHg (03/21 1355) SpO2:  [96 %-98 %] 98 % (03/21 1355)  Intake/Output from previous day: 03/20 0701 - 03/21 0700 In: 480 [P.O.:480] Out: 750 [Urine:750]  Labs:  Recent Labs  07/19/14 0239  WBC 11.2*  RBC 3.71*  HCT 33.0*  PLT 135*    Recent Labs  07/19/14 0239  NA 139  K 3.4*  CL 108  CO2 25  BUN 11  CREATININE 0.99  GLUCOSE 100*  CALCIUM 8.1*   No results for input(s): LABPT, INR in the last 72 hours.  Physical Exam: Neurologically intact ABD soft Intact pulses distally Incision: dressing C/D/I Compartment soft  Assessment/Plan: Patient stable  CT scan - no hardware complications noted Continue mobilization with physical therapy Continue care  Up with therapy  Mental status significantly improved Plan on mobilization with PT - possible home d/c Tuesday/Wednesday vs CIR.  Venita Lickahari Man Bonneau, MD [facility] [phone]

## 2014-07-21 NOTE — Evaluation (Signed)
Occupational Therapy Evaluation and Discharge Patient Details Name: Ralph Fowler MRN: [id number] DOB: [birth year] Today's Date: 07/21/2014    History of Present Illness 39 y.o. male s/p TRANSFORAMINAL LUMBAR INTERBODY FUSION (TLIF) WITH PEDICLE SCREW FIXATION 1 LEVEL L4-5,  he developed severe agitation and back pain which was refractory to narcotic pain treatment and was transferred to ICU.   Clinical Impression   This 39 yo male admitted and underwent above presents to acute OT with all education completed, we will D/C from acute OT.    Follow Up Recommendations  No OT follow up    Equipment Recommendations  None recommended by OT       Precautions / Restrictions Precautions Precautions: Back;Fall Precaution Comments: reviewed precautions. pt able to verbalize 3/3 back precautions with cues.  Required Braces or Orthoses: Spinal Brace Spinal Brace: Lumbar corset;Applied in sitting position Restrictions Weight Bearing Restrictions: No      Mobility Bed Mobility Overal bed mobility: Needs Assistance Bed Mobility: Rolling;Sidelying to Sit Rolling: Min guard Sidelying to sit: Min guard       General bed mobility comments: HOB flat, heavy use of rails for support. Good demo of log roll technique. Increased time and effort.   Transfers Overall transfer level: Needs assistance Equipment used: Rolling walker (2 wheeled) Transfers: Sit to/from Stand Sit to Stand: Supervision         General transfer comment: Min guard to rise from EOB (pt has high bed at home). Cues for hand placement.     Balance Overall balance assessment: Needs assistance Sitting-balance support: Feet supported;No upper extremity supported Sitting balance-Leahy Scale: Good Sitting balance - Comments: Able to donn brace in sitting with setup. Able to donn socks sitting EOB with increased time.   Standing balance support: During functional activity Standing balance-Leahy Scale: Poor Standing balance  comment: Relient on RW for support.                            ADL Overall ADL's : Modified independent                                       General ADL Comments: Pt able to cross legs to get to his feet for LBB/D, he is able to get up and down from toilet by just barely straddling toilet at front and rotating his feet outwards for sit<>stand, he has a walk in shower at home with hand held shower and he says he can manage getting a seat.Educated that it is easier to use wet wipes than toilet paper for hygiene--cleaner easier and more avoidance of twisting.     Vision Additional Comments: No change from baseline          Pertinent Vitals/Pain Pain Assessment: Faces Pain Score: 3  Faces Pain Scale: Hurts even more Pain Location: back with movement Pain Descriptors / Indicators: Sore;Aching Pain Intervention(s): Monitored during session;Repositioned;Premedicated before session     Hand Dominance Right   Extremity/Trunk Assessment Upper Extremity Assessment Upper Extremity Assessment: Overall WFL for tasks assessed           Communication Communication Communication: No difficulties   Cognition Arousal/Alertness: Awake/alert Behavior During Therapy: WFL for tasks assessed/performed Overall Cognitive Status: Within Functional Limits for tasks assessed  Home Living Family/patient expects to be discharged to:: Private residence Living Arrangements: Spouse/significant other Available Help at Discharge: Family (wife works as Engineer, site (off next week for spring break), father-in-law can A prn) Type of Home: House Home Access: Stairs to enter [employer] of Steps: 1 Entrance Stairs-Rails: None Home Layout: One level     Bathroom Shower/Tub: Producer, television/film/video: Standard     Home Equipment: Environmental consultant - 2 wheels;Crutches   Additional Comments: works at [employer] [place]      Prior Functioning/Environment Level of Independence: Independent with assistive device(s)        Comments: crutches used when back was problematic    OT Diagnosis: Generalized weakness;Acute pain                       Co-evaluation PT/OT/SLP Co-Evaluation/Treatment: Yes Reason for Co-Treatment: For patient/therapist safety PT goals addressed during session: Mobility/safety with mobility OT goals addressed during session: ADL's and self-care;Strengthening/ROM      End of Session Equipment Utilized During Treatment: Gait belt;Rolling walker;Back brace  Activity Tolerance: Patient tolerated treatment well Patient left: in chair;with call bell/phone within reach;with family/visitor present   Time: 1610-9604 OT Time Calculation (min): 37 min Charges:  OT General Charges $OT Visit: 1 Procedure OT Evaluation $Initial OT Evaluation Tier I: 1 Procedure  Evette Georges [phone] 07/21/2014, 3:44 PM

## 2014-07-21 NOTE — Progress Notes (Signed)
     Subjective: 2 Days Post-Op Procedure(s) (LRB): TRANSFORAMINAL LUMBAR INTERBODY FUSION (TLIF) WITH PEDICLE SCREW FIXATION 1 LEVEL L4-5 (N/A)   Patient reports pain as moderate - severe to lower back and legs. Pain control is his primary compliant at this time.  Feels that he is more himself today.  Objective:   VITALS:   Filed Vitals:   07/19/14  BP: 118/60  Pulse: 74  Temp: 98.5 F (36.9 C)  Resp: 21    Dorsiflexion/Plantar flexion intact Incision: dressing C/D/I No cellulitis present Compartment soft  LABS  Recent Labs  07/19/14 0239  HGB 11.0*  HCT 33.0*  WBC 11.2*  PLT 135*     Recent Labs  07/19/14 0239  NA 139  K 3.4*  BUN 11  CREATININE 0.99  GLUCOSE 100*     Assessment/Plan: 2 Days Post-Op Procedure(s) (LRB): TRANSFORAMINAL LUMBAR INTERBODY FUSION (TLIF) WITH PEDICLE SCREW FIXATION 1 LEVEL L4-5 (N/A)  Up with therapy Transfer to floor when appropriate Continue current care Monitor his agitation now that he is off of steroids    Anastasio AuerbachMatthew S. Shemeca Lukasik   PAC  07/19/2014, 8:45 AM

## 2014-07-21 NOTE — Progress Notes (Signed)
Inpatient Rehabilitation  We received IP Rehab screen request for possible CIR for this patient.  Unfortunately, with his diagnosis of single level TLIF, insurance typically will not approve an IP Rehab admission.  I will not request a consult at this time unless the acute team feels they would like for us to further pursue.  Please call if questions.  Weldon PickingSusan Florestine Carmical PT Inpatient Rehab Admissions Coordinator Cell [phone] Office [phone]

## 2014-07-22 MED ORDER — DOCUSATE SODIUM 100 MG PO CAPS: 200.0000 mg | ORAL_CAPSULE | Freq: Every day | ORAL | Status: DC | PRN

## 2014-07-22 MED ORDER — HYDROMORPHONE HCL 1 MG/ML IJ SOLN
1.0000 mg | INTRAMUSCULAR | Status: DC | PRN
  Administered 2014-07-22 – 2014-07-23 (×6): 1 mg via INTRAVENOUS
  Filled 2014-07-22 (×6): qty 1

## 2014-07-22 MED ORDER — POLYETHYLENE GLYCOL 3350 17 G PO PACK
17.0000 g | PACK | Freq: Every day | ORAL | Status: DC
  Administered 2014-07-22: 17 g via ORAL
  Filled 2014-07-22: qty 1

## 2014-07-22 NOTE — Progress Notes (Signed)
PT Cancellation Note  Patient Details Name: Venetia ConstableGary Spicher MRN: [id number] DOB: [birth year]   Cancelled Treatment:    Reason Eval/Treat Not Completed: Medical issues which prohibited therapy;Pain limiting ability to participate.  Pt refused treatment 3x today.  At 9:04 am, pt refused due to severe pain and lack of sleep.  At 12:40, pt was seen again, and was severely agitated due to pain and trouble with changing pain meds.  Pt has previously had a grand mal seizure, and thinks that walking again and lack of sleep will provoke another episode. Pt educated on precautions and importance of mobility; pt still refused therapy. Attempted again at 1:45, pt had just received pain meds and refused treatment because he was upset that his pain medications had been changed.     Genita Nilsson 07/22/2014, 12:57 PM  Vella RaringKailee Illyanna Petillo, SPT (student physical therapist) Office phone: [phone]

## 2014-07-22 NOTE — Progress Notes (Signed)
    Subjective: Procedure(s) (LRB): TRANSFORAMINAL LUMBAR INTERBODY FUSION (TLIF) WITH PEDICLE SCREW FIXATION 1 LEVEL L4-5 (N/A) 5 Days Post-Op  Patient reports pain as 4 on 0-10 scale.  Reports decreased leg pain reports incisional back pain   Positive void Negative bowel movement Positive flatus Negative chest pain or shortness of breath  Objective: Vital signs in last 24 hours: Temp:  [98.7 F (37.1 C)-99.6 F (37.6 C)] 99.6 F (37.6 C) (03/22 0551) Pulse Rate:  [83-102] 83 (03/22 0551) Resp:  [20] 20 (03/22 0551) BP: (122-132)/(79-89) 129/79 mmHg (03/22 0551) SpO2:  [95 %-98 %] 95 % (03/22 0551)  Intake/Output from previous day: 03/21 0701 - 03/22 0700 In: 1200 [P.O.:1200] Out: 500 [Urine:500]  Labs: No results for input(s): WBC, RBC, HCT, PLT in the last 72 hours. No results for input(s): NA, K, CL, CO2, BUN, CREATININE, GLUCOSE, CALCIUM in the last 72 hours. No results for input(s): LABPT, INR in the last 72 hours.  Physical Exam: Neurologically intact ABD soft Intact pulses distally Incision: dressing C/D/I Compartment soft  Assessment/Plan: Patient stable  xrays n/a Continue mobilization with physical therapy Continue care  Advance diet Up with therapy D/C IV fluids  D/c dilaudid and Toradol - continue oral percocet and robaxin Plan on d/c Wednesday Also d/c nicotine patch     Ralph Lickahari Afra Tricarico, MD [facility] [phone]

## 2014-07-23 MED ORDER — OXYCODONE-ACETAMINOPHEN 10-325 MG PO TABS: 1.0000 | ORAL_TABLET | ORAL | Status: DC | PRN

## 2014-07-23 MED ORDER — DOCUSATE SODIUM 100 MG PO CAPS: 100.0000 mg | ORAL_CAPSULE | Freq: Three times a day (TID) | ORAL | Status: DC | PRN

## 2014-07-23 MED ORDER — ONDANSETRON HCL 4 MG PO TABS: 4.0000 mg | ORAL_TABLET | Freq: Three times a day (TID) | ORAL | Status: DC | PRN

## 2014-07-23 NOTE — Discharge Summary (Signed)
Patient ID: Ralph Fowler Holz MRN: [id number] DOB/AGE: [birth year] 39 y.o.  Admit date: 07/17/2014 Discharge date: 07/23/2014  Admission Diagnoses:  Active Problems:   Back pain   Altered mental status   S/P spinal fusion   Encephalopathy acute   Post-operative state   Post-op pain   Discharge Diagnoses:  Active Problems:   Back pain   Altered mental status   S/P spinal fusion   Encephalopathy acute   Post-operative state   Post-op pain  status post Procedure(s): TRANSFORAMINAL LUMBAR INTERBODY FUSION (TLIF) WITH PEDICLE SCREW FIXATION 1 LEVEL L4-5  Past Medical History  Diagnosis Date  . Stomach ulcer   . Pancreatitis   . Testicular torsion   . Epididymitis   . Seizures     last grand mal was in 06/2012  . 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 male who was admitted 07/17/2014 for operative treatment of back pain. Patient failed conservative treatments (please see the history and physical for the specifics) and had severe unremitting pain that affects sleep, daily activities and work/hobbies. After pre-op clearance, the patient was taken to the operating room on 07/17/2014 and underwent  Procedure(s): TRANSFORAMINAL LUMBAR INTERBODY FUSION (TLIF) WITH PEDICLE SCREW FIXATION 1 LEVEL L4-5.    Patient was given perioperative antibiotics: Anti-infectives    Start     Dose/Rate Route Frequency Ordered Stop   07/17/14 2000  vancomycin (VANCOCIN) IVPB 1000 mg/200 mL premix     1,000 mg 200 mL/hr over 60 Minutes Intravenous  Once 07/17/14 1955 07/18/14 0142   07/17/14 0600  vancomycin (VANCOCIN) 1,500 mg in sodium chloride 0.9 % 500 mL IVPB     1,500 mg 250 mL/hr over 120 Minutes Intravenous 60 min pre-op 07/16/14 1316 07/17/14 0825       Patient was  given sequential compression devices and early ambulation to prevent DVT.   Patient benefited maximally from hospital stay and there were no complications. At the time of discharge, the patient was urinating/moving their bowels without difficulty, tolerating a regular diet, pain is controlled with oral pain medications and they have been cleared by PT/OT.   Recent vital signs: Patient Vitals for the past 24 hrs:  BP Temp Temp src Pulse Resp SpO2  07/23/14 0510 114/66 mmHg 98.7 F (37.1 C) Oral 84 18 96 %  07/22/14 2053 125/71 mmHg 99.7 F (37.6 C) Oral 83 20 95 %  07/22/14 1503 129/64 mmHg 98.6 F (37 C) Oral 89 20 100 %     Recent laboratory studies: No results for input(s): WBC, HGB, HCT, PLT, NA, K, CL, CO2, BUN, CREATININE, GLUCOSE, INR, CALCIUM in the last 72 hours.  Invalid input(s): PT, 2   Discharge Medications:     Medication List    STOP taking these medications        cyclobenzaprine 10 MG tablet  Commonly known as:  FLEXERIL     diclofenac sodium 1 % Gel  Commonly known as:  VOLTAREN     INFLUENZA A (H1N1) MONOVAL VAC IM      TAKE these medications        DEXILANT 30 MG capsule  Generic drug:  Dexlansoprazole  Take 30 mg by mouth 2 (two) times daily.     docusate sodium  100 MG capsule  Commonly known as:  COLACE  Take 1 capsule (100 mg total) by mouth 3 (three) times daily as needed for mild constipation.     methocarbamol 500 MG tablet  Commonly known as:  ROBAXIN  Take 500 mg by mouth every 6 (six) hours as needed for muscle spasms.     ondansetron 4 MG tablet  Commonly known as:  ZOFRAN  Take 1 tablet (4 mg total) by mouth every 8 (eight) hours as needed for nausea or vomiting.     oxyCODONE-acetaminophen 10-325 MG per tablet  Commonly known as:  PERCOCET  Take 1-2 tablets by mouth every 4 (four) hours as needed for pain.        Diagnostic Studies: Dg Lumbar Spine 2-3 Views  07/18/2014   CLINICAL DATA:  S/P spinal fusion yesterday. Pt  experiencing severe lumbar pain.  EXAM: LUMBAR SPINE - 2-3 VIEW  COMPARISON:  Lumbar CT, 07/18/2014 at 17:08 hours. Operative lumbar spine images from the posterior fusion performed yesterday.  FINDINGS: Bilateral pedicle screws at L4 and L5 and associated interconnecting rods are well-seated and aligned. Metallic disc spacer is well positioned centrally in the L4-L5 disc space maintaining disc height.  No acute fracture. The fracture adjacent to the pedicle screw at L4 on the right, seen on the current lumbar CT, is not visualized on standard radiographs.  Mild anterolisthesis of L4 on L5 stable from the operative radiographs as is the position of the orthopedic hardware.  Soft tissues are unremarkable.  IMPRESSION: Well-seated and well-aligned orthopedic hardware following L4-L5 posterior lumbar spine fusion. The appearance of the orthopedic hardware and alignment of the lower lumbar spine is stable from the operative images.   Electronically Signed   By: Amie Portland M.D.   On: 07/18/2014 17:59   Dg Lumbar Spine 2-3 Views  07/17/2014   CLINICAL DATA:  L4-L5 TLIF.  EXAM: DG C-ARM 61-120 MIN; LUMBAR SPINE - 2-3 VIEW  COMPARISON:  Lumbar spine MRI 10/05/2013.  FINDINGS: Posterior lumbar spinal fusion hardware demonstrated at the L4-5 level. Intervertebral disc spacer present L4-5. No acute abnormality.  IMPRESSION: Postoperative changes compatible with L4-5 fusion.   Electronically Signed   By: Annia Belt M.D.   On: 07/17/2014 15:28   Ct Lumbar Spine Wo Contrast  07/18/2014   CLINICAL DATA:  [age] year L male back pain. Left lower extremity pain lumbar surgery yesterday.  EXAM: CT LUMBAR SPINE WITHOUT CONTRAST  TECHNIQUE: Multidetector CT imaging of the lumbar spine was performed without intravenous contrast administration. Multiplanar CT image reconstructions were also generated.  COMPARISON:  MRI lumbar spine 10/05/2013.  FINDINGS: The patient is status post L4 discectomy and left laminectomy.  Pedicle screw and rod fixation is present at L4-5. Slight anterolisthesis is stable. 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 There is sclerosis a cleft are scratch the there is sclerosis across pars defect on the right. A minimally displaced fracture is present posteriorly at screw entry site on the right at L4. The pedicle is intact. There is no significant lucency associated with either scratch the there is no significant lucency associated with any of the pedicle screws.  Fluid and gas are present at the laminectomy site as expected. No focal stenosis is evident.  Limited imaging of the abdomen is unremarkable. No significant disc protrusion or stenosis is present.  IMPRESSION: 1. L4 discectomy was satisfactory positioning of the disc spacer. 2. Left laminectomy at L4. 3. Minimal nondisplaced fracture of the screw entry site on  the right at L4. Pedicles intact.   Electronically Signed   By: Marin Roberts M.D.   On: 07/18/2014 17:38   Dg C-arm 61-120 Min  07/17/2014   CLINICAL DATA:  L4-L5 TLIF.  EXAM: DG C-ARM 61-120 MIN; LUMBAR SPINE - 2-3 VIEW  COMPARISON:  Lumbar spine MRI 10/05/2013.  FINDINGS: Posterior lumbar spinal fusion hardware demonstrated at the L4-5 level. Intervertebral disc spacer present L4-5. No acute abnormality.  IMPRESSION: Postoperative changes compatible with L4-5 fusion.   Electronically Signed   By: Annia Belt M.D.   On: 07/17/2014 15:28   Hospital course: Patient with acute mental status changes evening of surgery.  Ultimately transferred to unit for prece dex.  CCM team consulted and accepted patient as transfer to ICU for drip and observation. Patient remained hemodynamically stable.  History of seizure disorder - Neurology consult to r/o seizure. Patient remained in ICU and slowly improved.  Mental status returned to baseline, and pain was under better control. CT scan was performed due to patients acute agitation and excessive mobility in the bed. No acute issues  seen. Patient transferred back to floor.  Noted to ambulate around the unit.  Positive improvement in pain control, positive BM, voiding spontaneously.  Patient remained neurologically intact.  Plan on d/c to home on 3/24 - POD#6.       Follow-up Information    Follow up with Alvy Beal, MD. Schedule an appointment as soon as possible for a visit in 10 days.   Specialty:  Orthopedic Surgery   Why:  For suture removal, For wound re-check   Contact information:   [address] [place] [address] [phone]       Discharge Plan:  discharge to home  Disposition: stable    Signed: Venita Lick D for Dr. Venita Lick [facility] [phone] 07/23/2014, 7:56 AM

## 2014-07-23 NOTE — Progress Notes (Signed)
    Subjective: Procedure(s) (LRB): TRANSFORAMINAL LUMBAR INTERBODY FUSION (TLIF) WITH PEDICLE SCREW FIXATION 1 LEVEL L4-5 (N/A) 6 Days Post-Op  Patient reports pain as 4 on 0-10 scale.  Reports decreased leg pain reports incisional back pain   Positive void Positive bowel movement Positive flatus Negative chest pain or shortness of breath  Objective: Vital signs in last 24 hours: Temp:  [98.6 F (37 C)-99.7 F (37.6 C)] 98.7 F (37.1 C) (03/23 0510) Pulse Rate:  [83-89] 84 (03/23 0510) Resp:  [18-20] 18 (03/23 0510) BP: (114-129)/(64-71) 114/66 mmHg (03/23 0510) SpO2:  [95 %-100 %] 96 % (03/23 0510)  Intake/Output from previous day: 03/22 0701 - 03/23 0700 In: 1080 [P.O.:1080] Out: 700 [Urine:700]  Labs: No results for input(s): WBC, RBC, HCT, PLT in the last 72 hours. No results for input(s): NA, K, CL, CO2, BUN, CREATININE, GLUCOSE, CALCIUM in the last 72 hours. No results for input(s): LABPT, INR in the last 72 hours.  Physical Exam: Neurologically intact ABD soft Intact pulses distally Incision: dressing C/D/I and no drainage Compartment soft  Assessment/Plan: Patient stable  xrays n/a Continue mobilization with physical therapy Continue care  Advance diet Up with therapy D/C IV fluids  Pain improved Ambulating around unit Mental status returned to baseline - A+OX3 D/c to home - f/u in 7-10 days  Ralph Lickahari Lota Leamer, MD [facility] [phone]

## 2014-07-23 NOTE — Progress Notes (Signed)
Physical Therapy Treatment Patient Details Name: Ralph Fowler MRN: [id number] DOB: Mar 28, 1976 Today's Date: 07/23/2014    History of Present Illness 39 y.o. male s/p TRANSFORAMINAL LUMBAR INTERBODY FUSION (TLIF) WITH PEDICLE SCREW FIXATION 1 LEVEL L4-5,  he developed severe agitation and back pain which was refractory to narcotic pain treatment and was transferred to ICU.    PT Comments    Pt agreeable to treatment today.  Pt is progressing well with PT goals and was able to demonstrate and verbalize knowledge of back precautions.  Pt was modified independent/supervision level function with bed mobility, transfers, ambulation and stairs necessary for return home.  Current plan for d/c to home is appropriate, per medical clearance and d/c.    Follow Up Recommendations  No PT follow up;Supervision - Intermittent     Equipment Recommendations  Rolling walker with 5" wheels    Recommendations for Other Services       Precautions / Restrictions Precautions Precautions: Back Precaution Booklet Issued: Yes (comment) Precaution Comments: Pt was able to verbalize 3/3 back precautions Required Braces or Orthoses: Spinal Brace Spinal Brace: Lumbar corset;Applied in sitting position (Pt demo's independence with donning) Restrictions Weight Bearing Restrictions: No    Mobility  Bed Mobility Overal bed mobility: Independent Bed Mobility: Rolling;Sidelying to Sit;Sit to Sidelying Rolling: Modified independent (Device/Increase time) Sidelying to sit: Modified independent (Device/Increase time)     Sit to sidelying: Modified independent (Device/Increase time) General bed mobility comments: Able to demonstrate log roll and proper transfer technique with no v/c, increased time due to precautions  Transfers Overall transfer level: Needs assistance Equipment used: Rolling walker (2 wheeled) Transfers: Sit to/from Stand Sit to Stand: Supervision         General transfer comment: Pt did not  use bed to press into standing, however, safely got up and maintained neutral spine alignment  Ambulation/Gait Ambulation/Gait assistance: Modified independent (Device/Increase time) Ambulation Distance (Feet): 510 Feet Assistive device: Rolling walker (2 wheeled) Gait Pattern/deviations: Decreased stride length;Step-to pattern;Antalgic Gait velocity: Slow   General Gait Details: Cues for upright posture, guarded gait pattern, decreased LLE foot clearance.educated to contract abdominal muscles to decrease stress on back   Stairs Stairs: Yes Stairs assistance: Supervision Stair Management: No rails;With walker Number of Stairs: 2 General stair comments: Able to demonstrate stair navigation safely on 2 stairs with RW  Wheelchair Mobility    Modified Rankin (Stroke Patients Only)       Balance Overall balance assessment: Needs assistance         Standing balance support: Bilateral upper extremity supported Standing balance-Leahy Scale: Fair Standing balance comment: Used RW for B UE support with functional activity/standing balance                    Cognition Arousal/Alertness: Awake/alert Behavior During Therapy: WFL for tasks assessed/performed Overall Cognitive Status: Within Functional Limits for tasks assessed                      Exercises      General Comments General comments (skin integrity, edema, etc.): Pt more agreeable to treatment today      Pertinent Vitals/Pain Pain Assessment: 0-10 Pain Score: 7  Pain Location: Back Pain Descriptors / Indicators: Sore Pain Intervention(s): Monitored during session    Home Living                      Prior Function  PT Goals (current goals can now be found in the care plan section) Acute Rehab PT Goals Patient Stated Goal: Return home Progress towards PT goals: Progressing toward goals    Frequency  Min 5X/week    PT Plan Current plan remains appropriate     Co-evaluation             End of Session Equipment Utilized During Treatment: Gait belt;Back brace Activity Tolerance: Patient tolerated treatment well Patient left: in bed;with call bell/phone within reach     Time: 0853-0912 PT Time Calculation (min) (ACUTE ONLY): 19 min  Charges:  $Gait Training: 8-22 mins                    G Codes:      Ambrose Wile 07/23/2014, 11:12 AM  Vella RaringKailee Valree Feild, SPT (student physical therapist) Office phone: [phone]

## 2014-12-26 ENCOUNTER — Encounter (HOSPITAL_COMMUNITY): Payer: Self-pay | Admitting: Emergency Medicine

## 2014-12-26 ENCOUNTER — Emergency Department (HOSPITAL_COMMUNITY): Payer: BC Managed Care – PPO

## 2014-12-26 ENCOUNTER — Emergency Department (HOSPITAL_COMMUNITY)
Admission: EM | Admit: 2014-12-26 | Discharge: 2014-12-27 | Disposition: A | Discharge status: Home / Self Care | Payer: BC Managed Care – PPO | Attending: Emergency Medicine | Admitting: Emergency Medicine

## 2014-12-26 DIAGNOSIS — Z87891 Personal history of nicotine dependence: Secondary | ICD-10-CM | POA: Diagnosis not present

## 2014-12-26 DIAGNOSIS — Y998 Other external cause status: Secondary | ICD-10-CM | POA: Diagnosis not present

## 2014-12-26 DIAGNOSIS — Y9389 Activity, other specified: Secondary | ICD-10-CM | POA: Insufficient documentation

## 2014-12-26 DIAGNOSIS — Y9289 Other specified places as the place of occurrence of the external cause: Secondary | ICD-10-CM | POA: Insufficient documentation

## 2014-12-26 DIAGNOSIS — S3992XA Unspecified injury of lower back, initial encounter: Secondary | ICD-10-CM

## 2014-12-26 DIAGNOSIS — X58XXXA Exposure to other specified factors, initial encounter: Secondary | ICD-10-CM | POA: Insufficient documentation

## 2014-12-26 DIAGNOSIS — M1990 Unspecified osteoarthritis, unspecified site: Secondary | ICD-10-CM | POA: Diagnosis not present

## 2014-12-26 DIAGNOSIS — Z79899 Other long term (current) drug therapy: Secondary | ICD-10-CM | POA: Diagnosis not present

## 2014-12-26 MED ORDER — HYDROCODONE-ACETAMINOPHEN 5-325 MG PO TABS: 1.0000 | ORAL_TABLET | Freq: Once | ORAL | Status: DC

## 2014-12-26 MED ORDER — OXYCODONE-ACETAMINOPHEN 5-325 MG PO TABS
2.0000 | ORAL_TABLET | Freq: Once | ORAL | Status: AC
  Administered 2014-12-26: 2 via ORAL
  Filled 2014-12-26: qty 2

## 2014-12-26 NOTE — ED Notes (Signed)
Pt. injured his lower back while riding a water tube ride at [facility] this afternoon , presents with low back pain , left hip pain and left foot numbness.

## 2014-12-26 NOTE — ED Notes (Signed)
Pts oral temp 98.9  Reported to PA.

## 2014-12-26 NOTE — ED Provider Notes (Signed)
History  This chart was scribed for non-physician practitioner, Josephina Gip, PA-C,working with Forde Dandy, MD, by Marlowe Kays, ED Scribe. This patient was seen in room TR11C/TR11C and the patient's care was started at 8:58 PM.  Chief Complaint  Patient presents with  . Back Injury   The history is provided by the patient and medical records. No language interpreter was used.    HPI Comments:  Ralph Fowler is a 39 y.o. male with PMHx of chronic back pain who presents to the Emergency Department complaining of severe lower back pain that radiates partially down his left buttock. Pt reports associated left foot "pins and needles". He states he had a spinal fusion approximately 16 months ago by a doctor at [facility]. He states he rode a water slide tube ride at a water park earlier today which exacerbated his back pain. He has not taken anything for pain since the injury but reports taking his daily Tramadol and Robaxin this morning. Movement of any kind makes the pain worse. Ice packs alleviate the pain. He denies nausea, vomiting, bruising, wounds, weakness of the lower extremities, numbness of the groin, bowel or bladder incontinence, CP or SOB. He has been ambulatory since the incident and is now using crutches that he already had at home. He states he contacted [facility] and was told to come here for lumbar xray. He reports allergies to Versed.  Past Medical History  Diagnosis Date  . Stomach ulcer   . Pancreatitis   . Testicular torsion   . Epididymitis   . Seizures     last grand mal was in 06/2012  . 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 Knee arthroscopy      right and left  . Rotator cuff repair      left  . Shoulder arthroscopy      left  . Tonsillectomy    . Palate / uvula biopsy / excision    . Wisdom tooth extraction    . Testicular exploration    . Transforaminal lumbar interbody fusion (tlif)  with pedicle screw fixation 1 level  07/17/2014    L 4 L5  . Back surgery     No family history on file. Social History  Substance Use Topics  . Smoking status: Former Research scientist (life sciences)  . Smokeless tobacco: Current User    Types: Chew  . Alcohol Use: Yes    Review of Systems  Constitutional: Negative for fever and chills.  HENT: Negative for congestion, ear pain, rhinorrhea, sneezing and sore throat.   Respiratory: Negative for cough and shortness of breath.   Cardiovascular: Negative for chest pain.  Gastrointestinal: Negative for nausea, vomiting, abdominal pain and diarrhea.  Genitourinary: Negative for dysuria.       No bowel or bladder incontinence  Musculoskeletal: Positive for back pain and gait problem. Negative for neck pain.  Skin: Negative for color change and wound.  Neurological: Negative for syncope, weakness, light-headedness, numbness and headaches.       Left foot paresthesia    Allergies  Versed  Home Medications   Prior to Admission medications   Medication Sig Start Date End Date Taking? Authorizing Provider  Dexlansoprazole (DEXILANT) 30 MG capsule Take 30 mg by mouth 2 (two) times daily.    Historical Provider, MD  docusate sodium (COLACE) 100 MG capsule Take 1 capsule (100 mg total) by mouth 3 (three) times daily as  needed for mild constipation. 07/23/14   Melina Schools, MD  methocarbamol (ROBAXIN) 500 MG tablet Take 500 mg by mouth every 6 (six) hours as needed for muscle spasms.    Historical Provider, MD  ondansetron (ZOFRAN) 4 MG tablet Take 1 tablet (4 mg total) by mouth every 8 (eight) hours as needed for nausea or vomiting. 07/23/14   Melina Schools, MD  oxyCODONE-acetaminophen (PERCOCET/ROXICET) 5-325 MG per tablet Take 1-2 tablets by mouth every 4 (four) hours as needed for severe pain. 12/27/14   Seaver Machia, PA-C   Triage Vitals: BP 144/85 mmHg  Pulse 63  Temp(Src) 100.1 F (37.8 C) (Oral)  Resp 18  SpO2 98% Physical Exam  Constitutional: He is  oriented to person, place, and time. He appears well-developed and well-nourished. He appears distressed.  HENT:  Head: Normocephalic and atraumatic.  Mouth/Throat: Oropharynx is clear and moist. No oropharyngeal exudate.  Eyes: Conjunctivae and EOM are normal. Pupils are equal, round, and reactive to light.  Neck: Normal range of motion.  Cardiovascular: Normal rate, regular rhythm and normal heart sounds.  Exam reveals no friction rub.   No murmur heard. Pedal pulses palpable. Cap refill < 3  Pulmonary/Chest: Effort normal and breath sounds normal. No respiratory distress. He has no wheezes. He has no rales.  Abdominal: Soft. There is no tenderness.  Musculoskeletal: Normal range of motion. He exhibits tenderness.  Pt able to straight leg raise both legs but with moderate pain. Knee and ankle ROM intact. Significant pain to palpation of lumbar spine. No TTP of hip or knee. . Straight leg raise causes pain in lumbar spine without radiation down the back of the thighs.  Neurological: He is alert and oriented to person, place, and time.  5/5 dorsi/plantarflexion strength. Sensation to light touch intact bilateral lower extremities. Pt reports he feels "pins and needles" of the left foot. Is still able to differentiate light touch and sharp touch.   Skin: Skin is warm and dry.  Psychiatric: He has a normal mood and affect. His behavior is normal.  Nursing note and vitals reviewed.   ED Course  Procedures (including critical care time) DIAGNOSTIC STUDIES: Oxygen Saturation is 98% on RA, normal by my interpretation.   COORDINATION OF CARE: 9:09 PM- Will order X-Ray of lumbar spine and order Percocet for pain prior to imaging. Pt verbalizes understanding and agrees to plan.  Medications  oxyCODONE-acetaminophen (PERCOCET/ROXICET) 5-325 MG per tablet 2 tablet (2 tablets Oral Given 12/26/14 2129)  ketorolac (TORADOL) 30 MG/ML injection 60 mg (60 mg Intramuscular Given 12/27/14 0122)    Labs  Review Labs Reviewed  CBC WITH DIFFERENTIAL/PLATELET - Abnormal; Notable for the following:    RBC 4.20 (*)    Hemoglobin 12.5 (*)    HCT 37.4 (*)    All other components within normal limits  I-STAT CHEM 8, ED - Abnormal; Notable for the following:    Potassium 3.4 (*)    Glucose, Bld 118 (*)    All other components within normal limits  C-REACTIVE PROTEIN  SEDIMENTATION RATE    Imaging Review Dg Lumbar Spine Complete  12/26/2014   CLINICAL DATA:  Severe low back pain radiating down LEFT leg with LEFT foot numbness today, did a water tube ride at [facility] earlier today, landed on his back after a sudden drop, immediate pain, prior L4-L5 fusion 5 months ago  EXAM: LUMBAR SPINE - COMPLETE 4+ VIEW  COMPARISON:  12/13/2014, 07/18/2014  FINDINGS: Five non-rib-bearing lumbar vertebra.  Prior posterior  fusion L4-L5 with intact hardware, unchanged.  Minimal anterior height losses of T11, T12, and L1 vertebra, unchanged since 07/18/2014.  No acute fracture, subluxation or bone destruction.  SI joints symmetric.  IMPRESSION: Prior L4-L5 fusion.  No acute lumbar spine abnormalities.   Electronically Signed   By: Lavonia Dana M.D.   On: 12/26/2014 22:09   I have personally reviewed and evaluated these images and lab results as part of my medical decision-making.   EKG Interpretation None       Pt noted to have temperature of 100.1 on triage. No signs or symptoms of infection. Pt was at water park and got sunburned today. Discussed case with Dr. Oleta Mouse who recommends monitoring of temperature, CBC, ESR and CRP before discharge. If inflammatory markers are negative, pt can be discharged with close follow up next week by his orthopedist.  BP 144/85 mmHg  Pulse 63  Temp(Src) 98.9 F (37.2 C) (Oral)  Resp 18  SpO2 98%   MDM   Final diagnoses:  Back injury, initial encounter   Pt presenting with acute worsening of chronic back pain after riding a water slide at an amusement park. Pain is  described as severe. Pt is still able to ambulate with crutches for support. He endorses pain that radiates into the top of his left buttock and "pins and needles" of the left foot. Given percocet and toradol in ED with moderate pain relief. Denies pain radiating down the back of the thighs, loss of bowel or bladder control or paresthesias of the groin. Found to be febrile in ED of 100.1. Pt denies any symptoms of infection. He was sunburned today. Discussed this with Dr. Oleta Mouse who recommended getting a CBC, CRP and sed rate before discharge. WBC 7.5, CRP <0.5 and sed rate 2. Pt discharged home with percocet for pain and will continue taking home medication robaxin. Pt will call his orthopedic doctor as soon as possible to schedule a follow up appointment for early next week. Pt agrees with this plan. Return precautions discussed with patient and given in discharge paperwork.  I personally performed the services described in this documentation, which was scribed in my presence. The recorded information has been reviewed and is accurate.    Josephina Gip, PA-C 12/27/14 1816  Forde Dandy, MD 12/28/14 1224

## 2014-12-27 LAB — CBC WITH DIFFERENTIAL/PLATELET
BASOS ABS: 0 10*3/uL (ref 0.0–0.1)
Basophils Relative: 1 % (ref 0–1)
Eosinophils Absolute: 0.2 10*3/uL (ref 0.0–0.7)
Eosinophils Relative: 3 % (ref 0–5)
HEMATOCRIT: 37.4 % — AB (ref 39.0–52.0)
Hemoglobin: 12.5 g/dL — ABNORMAL LOW (ref 13.0–17.0)
LYMPHS PCT: 39 % (ref 12–46)
Lymphs Abs: 2.9 10*3/uL (ref 0.7–4.0)
MCH: 29.8 pg (ref 26.0–34.0)
MCHC: 33.4 g/dL (ref 30.0–36.0)
MCV: 89 fL (ref 78.0–100.0)
Monocytes Absolute: 0.6 10*3/uL (ref 0.1–1.0)
Monocytes Relative: 8 % (ref 3–12)
NEUTROS ABS: 3.7 10*3/uL (ref 1.7–7.7)
Neutrophils Relative %: 49 % (ref 43–77)
Platelets: 170 10*3/uL (ref 150–400)
RBC: 4.2 MIL/uL — ABNORMAL LOW (ref 4.22–5.81)
RDW: 14.2 % (ref 11.5–15.5)
WBC: 7.5 10*3/uL (ref 4.0–10.5)

## 2014-12-27 LAB — I-STAT CHEM 8, ED
BUN: 10 mg/dL (ref 6–20)
CHLORIDE: 103 mmol/L (ref 101–111)
Calcium, Ion: 1.17 mmol/L (ref 1.12–1.23)
Creatinine, Ser: 1.1 mg/dL (ref 0.61–1.24)
Glucose, Bld: 118 mg/dL — ABNORMAL HIGH (ref 65–99)
HCT: 40 % (ref 39.0–52.0)
Hemoglobin: 13.6 g/dL (ref 13.0–17.0)
Potassium: 3.4 mmol/L — ABNORMAL LOW (ref 3.5–5.1)
SODIUM: 143 mmol/L (ref 135–145)
TCO2: 23 mmol/L (ref 0–100)

## 2014-12-27 LAB — C-REACTIVE PROTEIN: CRP: <0.5 mg/dL (ref <1.0)

## 2014-12-27 LAB — SEDIMENTATION RATE: Sed Rate: 2 mm/hr (ref 0–16)

## 2014-12-27 MED ORDER — KETOROLAC TROMETHAMINE 30 MG/ML IJ SOLN
30.0000 mg | Freq: Once | INTRAMUSCULAR | Status: DC
  Filled 2014-12-27: qty 1

## 2014-12-27 MED ORDER — KETOROLAC TROMETHAMINE 30 MG/ML IJ SOLN
60.0000 mg | Freq: Once | INTRAMUSCULAR | Status: AC
  Administered 2014-12-27: 60 mg via INTRAMUSCULAR
  Filled 2014-12-27: qty 2

## 2014-12-27 MED ORDER — OXYCODONE-ACETAMINOPHEN 5-325 MG PO TABS
1.0000 | ORAL_TABLET | ORAL | Status: DC | PRN
Start: 2014-12-27 — End: 2015-02-28

## 2014-12-27 NOTE — Discharge Instructions (Signed)
-   Call Dr. Shon Baton as soon as possible to schedule an appointment for early next week - Continue taking robaxin as prescribed - Take percocet as needed for pain - Rest and use ice packs for pain relief over weekend - Return to ED with increasing severity of pain, loss of bowel or bladder function, weakness of the legs, loss of sensation in the legs or further worsening of symptoms

## 2014-12-28 ENCOUNTER — Encounter (HOSPITAL_BASED_OUTPATIENT_CLINIC_OR_DEPARTMENT_OTHER): Payer: Self-pay | Admitting: *Deleted

## 2014-12-28 ENCOUNTER — Emergency Department (HOSPITAL_BASED_OUTPATIENT_CLINIC_OR_DEPARTMENT_OTHER)
Admission: EM | Admit: 2014-12-28 | Discharge: 2014-12-28 | Disposition: A | Discharge status: Home / Self Care | Payer: BC Managed Care – PPO | Attending: Emergency Medicine | Admitting: Emergency Medicine

## 2014-12-28 DIAGNOSIS — Z87891 Personal history of nicotine dependence: Secondary | ICD-10-CM | POA: Insufficient documentation

## 2014-12-28 DIAGNOSIS — Z8719 Personal history of other diseases of the digestive system: Secondary | ICD-10-CM | POA: Diagnosis not present

## 2014-12-28 DIAGNOSIS — Z79899 Other long term (current) drug therapy: Secondary | ICD-10-CM | POA: Diagnosis not present

## 2014-12-28 DIAGNOSIS — Z87438 Personal history of other diseases of male genital organs: Secondary | ICD-10-CM | POA: Insufficient documentation

## 2014-12-28 DIAGNOSIS — M545 Low back pain: Secondary | ICD-10-CM | POA: Insufficient documentation

## 2014-12-28 DIAGNOSIS — M1990 Unspecified osteoarthritis, unspecified site: Secondary | ICD-10-CM | POA: Insufficient documentation

## 2014-12-28 MED ORDER — DIAZEPAM 5 MG/ML IJ SOLN
5.0000 mg | Freq: Once | INTRAMUSCULAR | Status: AC
  Administered 2014-12-28: 5 mg via INTRAVENOUS
  Filled 2014-12-28: qty 2

## 2014-12-28 MED ORDER — MORPHINE SULFATE (PF) 4 MG/ML IV SOLN
4.0000 mg | Freq: Once | INTRAVENOUS | Status: AC
  Administered 2014-12-28: 4 mg via INTRAVENOUS
  Filled 2014-12-28: qty 1

## 2014-12-28 MED ORDER — METHOCARBAMOL 1000 MG/10ML IJ SOLN
1000.0000 mg | Freq: Once | INTRAMUSCULAR | Status: AC
  Administered 2014-12-28: 1000 mg via INTRAMUSCULAR
  Filled 2014-12-28: qty 10

## 2014-12-28 MED ORDER — METHYLPREDNISOLONE SODIUM SUCC 125 MG IJ SOLR
125.0000 mg | Freq: Once | INTRAMUSCULAR | Status: AC
  Administered 2014-12-28: 125 mg via INTRAVENOUS
  Filled 2014-12-28: qty 2

## 2014-12-28 MED ORDER — SODIUM CHLORIDE 0.9 % IV BOLUS (SEPSIS)
1000.0000 mL | Freq: Once | INTRAVENOUS | Status: AC
  Administered 2014-12-28: 1000 mL via INTRAVENOUS

## 2014-12-28 MED ORDER — MORPHINE SULFATE (PF) 2 MG/ML IV SOLN
2.0000 mg | Freq: Once | INTRAVENOUS | Status: AC
Start: 2014-12-28 — End: 2014-12-28
  Administered 2014-12-28: 2 mg via INTRAVENOUS
  Filled 2014-12-28: qty 1

## 2014-12-28 NOTE — ED Notes (Signed)
Pt has hx of lower back surgery and re-injured his back 2 days ago at a water park. Pt was seen that same day at [facility] ER but today pt is experiencing loss of bowel control and has not been able to urinate today.

## 2014-12-28 NOTE — Discharge Instructions (Signed)
Please follow up with your othropedist tomorrow.

## 2014-12-28 NOTE — ED Provider Notes (Addendum)
CSN: [id number]     Arrival date & time 12/28/14  1804 History  This chart was scribed for Margarita Grizzle, MD by Lyndel Safe, ED Scribe. This patient was seen in room MH07/MH07 and the patient's care was started 6:21 PM.   Chief Complaint  Patient presents with  . Back Pain   Patient is a 39 y.o. male presenting with back pain. The history is provided by the patient. No language interpreter was used.  Back Pain Location:  Lumbar spine Quality:  Stabbing Radiates to: left, lower abdomen. Pain severity:  Severe Pain is:  Same all the time Onset quality:  Sudden Duration:  2 days Timing:  Constant Progression:  Unchanged Chronicity:  Chronic Context: recent injury   Relieved by:  Narcotics Worsened by:  Ambulation, movement and standing Ineffective treatments:  Muscle relaxants Associated symptoms: bowel incontinence    HPI Comments: Ralph Fowler is a 39 y.o. male, with chronic lower back pain, who presents to the Emergency Department complaining of a sudden onset exacerbation of his chronic lumbar spine back pain s/p injury that occurred 2 days ago. Pt states the stabbing pain in left, lower back is exacerbated with movement and ambulation. Pt notes the pain radiates to his lower left abdomen. He additionally states he reached for the remote while laying on the couch today and upon straining for the remote he had a BM. He called his PCP after this event and he was advised to come to the ED for evaluation. He also notes he has not urinated since last night despite consuming fluids as normal today. Exacerbation of pain with weight bearing. Pt re-injured his lower back 2 days ago while [place] amusement park. Pt was evaluated the same day of the injury in the [facility] ED 2 days ago for the same complaint of lumbar pack pain. On this visit 2 days ago he had a normal lumbar Xray and was discharged with a percocet prescription and instructions to follow up with his orthopedic doctor  Dr. Shon Baton. He notes mild relief after ED visit 2 days ago with use of percocet. PShx of lumbar spinal fusion. He has been ambulating with crutches since the accident 2 days ago but was able to drive himself to ED this evening and ambulate to and from car and into ED. Numbness of left foot at baseline. Pt is followed by a PCP at [facility] in  [place]. He takes tramadol TID, Robaxin (2 tablets TID), gabapentin and Lyrica for his chronic back pain. No longer taking Clonazepam, pt states 'he takes too many medications and he stopped taking the clonazepam per himself.' Allergy to versed. Unable to take NSAIDs due to stomach ulcers. Pt is calling Dr. Shon Baton office tomorrow to schedule appointment for this week.    Past Medical History  Diagnosis Date  . Stomach ulcer   . Pancreatitis   . Testicular torsion   . Epididymitis   . Seizures     last grand mal was in 06/2012  . 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 Knee arthroscopy      right and left  . Rotator cuff repair      left  . Shoulder arthroscopy      left  . Tonsillectomy    . Palate / uvula biopsy / excision    . Wisdom tooth extraction    . Testicular exploration    .  Transforaminal lumbar interbody fusion (tlif) with pedicle screw fixation 1 level  07/17/2014    L 4 L5  . Back surgery     No family history on file. Social History  Substance Use Topics  . Smoking status: Former Games developer  . Smokeless tobacco: Current User    Types: Chew  . Alcohol Use: Yes    Review of Systems  Gastrointestinal: Positive for bowel incontinence.  Musculoskeletal: Positive for back pain.  All other systems reviewed and are negative.  Allergies  Versed  Home Medications   Prior to Admission medications   Medication Sig Start Date End Date Taking? Authorizing Provider  Dexlansoprazole (DEXILANT) 30 MG capsule Take 30 mg by mouth 2 (two) times daily.    Historical Provider, MD  docusate  sodium (COLACE) 100 MG capsule Take 1 capsule (100 mg total) by mouth 3 (three) times daily as needed for mild constipation. 07/23/14   Venita Lick, MD  methocarbamol (ROBAXIN) 500 MG tablet Take 500 mg by mouth every 6 (six) hours as needed for muscle spasms.    Historical Provider, MD  ondansetron (ZOFRAN) 4 MG tablet Take 1 tablet (4 mg total) by mouth every 8 (eight) hours as needed for nausea or vomiting. 07/23/14   Venita Lick, MD  oxyCODONE-acetaminophen (PERCOCET/ROXICET) 5-325 MG per tablet Take 1-2 tablets by mouth every 4 (four) hours as needed for severe pain. 12/27/14   Stevi Barrett, PA-C   BP 150/92 mmHg  Pulse 81  Temp(Src) 99.6 F (37.6 C) (Oral)  Resp 18  Ht  (1.803 m)  Wt 230 lb (104.327 kg)  BMI 32.09 kg/m2  SpO2 100% Physical Exam  Constitutional: He is oriented to person, place, and time. He appears well-developed and well-nourished.  Uncomfortable appearing  HENT:  Head: Normocephalic and atraumatic.  Right Ear: Tympanic membrane and external ear normal.  Left Ear: Tympanic membrane and external ear normal.  Nose: Nose normal. Right sinus exhibits no maxillary sinus tenderness and no frontal sinus tenderness. Left sinus exhibits no maxillary sinus tenderness and no frontal sinus tenderness.  Eyes: Conjunctivae and EOM are normal. Pupils are equal, round, and reactive to light. Right eye exhibits no nystagmus. Left eye exhibits no nystagmus.  Neck: Normal range of motion. Neck supple.  Cardiovascular: Normal rate, regular rhythm, normal heart sounds and intact distal pulses.   Pulmonary/Chest: Effort normal and breath sounds normal. No respiratory distress. He exhibits no tenderness.  Abdominal: Soft. Bowel sounds are normal. He exhibits no distension and no mass. There is no tenderness.  Genitourinary:  Good sphincter tone   Musculoskeletal: Normal range of motion. He exhibits no edema or tenderness.  Neurological: He is alert and oriented to person, place,  and time. He has normal strength and normal reflexes. No sensory deficit. He displays a negative Romberg sign. GCS eye subscore is 4. GCS verbal subscore is 5. GCS motor subscore is 6.  Reflex Scores:      Tricep reflexes are 2+ on the right side and 2+ on the left side.      Bicep reflexes are 2+ on the right side and 2+ on the left side.      Brachioradialis reflexes are 2+ on the right side and 2+ on the left side.      Patellar reflexes are 2+ on the right side and 2+ on the left side.      Achilles reflexes are 2+ on the right side and 2+ on the left side. 5/5 strength in BLE.  Skin: Skin is warm and dry. No rash noted.  Psychiatric: He has a normal mood and affect. His behavior is normal. Judgment and thought content normal.  Nursing note and vitals reviewed.   ED Course  Procedures  DIAGNOSTIC STUDIES: Oxygen Saturation is 100% on RA, normal by my interpretation.    COORDINATION OF CARE: 6:35 PM Discussed treatment plan with pt. Will perform rectal exam to evaluate rectal tone. Chaperone present throughout entire exam. Will order bladder scan. Valium, solu-medrol and robaxin ordered. Pt acknowledges and agrees to plan.   Labs Review Labs Reviewed - No data to display  Imaging Review Dg Lumbar Spine Complete  12/26/2014   CLINICAL DATA:  Severe low back pain radiating down LEFT leg with LEFT foot numbness today, did a water tube ride at [facility] earlier today, landed on his back after a sudden drop, immediate pain, prior L4-L5 fusion 5 months ago  EXAM: LUMBAR SPINE - COMPLETE 4+ VIEW  COMPARISON:  12/13/2014, 07/18/2014  FINDINGS: Five non-rib-bearing lumbar vertebra.  Prior posterior fusion L4-L5 with intact hardware, unchanged.  Minimal anterior height losses of T11, T12, and L1 vertebra, unchanged since 07/18/2014.  No acute fracture, subluxation or bone destruction.  SI joints symmetric.  IMPRESSION: Prior L4-L5 fusion.  No acute lumbar spine abnormalities.   Electronically  Signed   By: Ulyses Southward M.D.   On: 12/26/2014 22:09   I have personally reviewed and evaluated these images and lab results as part of my medical decision-making.   EKG Interpretation None      MDM   Final diagnoses:  Left low back pain, with sciatica presence unspecified   39 year old male with chronic back pain who presents today complaining of worsening pain since riding a water park ride on Thursday. He reports pain chiefly located in his left buttock. He has increased pain with trying to walk. He states that he had an episode of losing bowel control today and was told to come to ED. He also reported that he had not voiced day. Here he has voided without difficulty. His rectal exam reveals good rectal tone. His neurologic exam reveals good strength with normal sensation in saddle area. Patient was given Valium, Solu-Medrol, Robaxin, and morphine continues to complain of pain. He is on tramadol, Robaxin, Lyrica. After reviewing his previous medical history and difficulty with pain control post surgery, it appears we may be unable to obtain significant pain relief here in emergency department. He has pain medicine at home and follow up in place. His neurological exam here is reassuring for no weakness, bladder or rectal sphincter paralysis, sensory loss in dermatomal distribution, and normal reflexes. Patient will follow-up with his primary care physician.   I personally performed the services described in this documentation, which was scribed in my presence. The recorded information has been reviewed and considered.   Margarita Grizzle, MD 12/31/14 1718  I personally performed the services described in this documentation, which was scribed in my presence. The recorded information has been reviewed and considered.   Margarita Grizzle, MD 12/31/14 [phone]

## 2015-02-20 ENCOUNTER — Other Ambulatory Visit: Payer: Self-pay | Admitting: Orthopedic Surgery

## 2015-02-20 DIAGNOSIS — Z981 Arthrodesis status: Secondary | ICD-10-CM

## 2015-02-23 ENCOUNTER — Emergency Department (HOSPITAL_BASED_OUTPATIENT_CLINIC_OR_DEPARTMENT_OTHER)
Admission: EM | Admit: 2015-02-23 | Discharge: 2015-02-23 | Discharge status: Against Medical Advice | Payer: BC Managed Care – PPO | Attending: Emergency Medicine | Admitting: Emergency Medicine

## 2015-02-23 ENCOUNTER — Encounter (HOSPITAL_BASED_OUTPATIENT_CLINIC_OR_DEPARTMENT_OTHER): Payer: Self-pay | Admitting: *Deleted

## 2015-02-23 DIAGNOSIS — B029 Zoster without complications: Secondary | ICD-10-CM | POA: Diagnosis not present

## 2015-02-23 DIAGNOSIS — H578 Other specified disorders of eye and adnexa: Secondary | ICD-10-CM | POA: Diagnosis not present

## 2015-02-23 NOTE — ED Notes (Signed)
States he had shingles in July. Here today with drainage from his right eye and a blister to his eyelid.

## 2015-02-26 ENCOUNTER — Inpatient Hospital Stay: Admission: RE | Admit: 2015-02-26 | Payer: BC Managed Care – PPO | Source: Ambulatory Visit

## 2015-02-28 ENCOUNTER — Emergency Department (HOSPITAL_COMMUNITY): Payer: BC Managed Care – PPO

## 2015-02-28 ENCOUNTER — Encounter (HOSPITAL_COMMUNITY): Payer: Self-pay | Admitting: Family Medicine

## 2015-02-28 ENCOUNTER — Emergency Department (HOSPITAL_COMMUNITY)
Admission: EM | Admit: 2015-02-28 | Discharge: 2015-02-28 | Disposition: A | Discharge status: Home / Self Care | Payer: BC Managed Care – PPO | Attending: Emergency Medicine | Admitting: Emergency Medicine

## 2015-02-28 DIAGNOSIS — Z87438 Personal history of other diseases of male genital organs: Secondary | ICD-10-CM | POA: Insufficient documentation

## 2015-02-28 DIAGNOSIS — Y998 Other external cause status: Secondary | ICD-10-CM | POA: Diagnosis not present

## 2015-02-28 DIAGNOSIS — Z79899 Other long term (current) drug therapy: Secondary | ICD-10-CM | POA: Insufficient documentation

## 2015-02-28 DIAGNOSIS — X58XXXA Exposure to other specified factors, initial encounter: Secondary | ICD-10-CM | POA: Insufficient documentation

## 2015-02-28 DIAGNOSIS — Z8719 Personal history of other diseases of the digestive system: Secondary | ICD-10-CM | POA: Insufficient documentation

## 2015-02-28 DIAGNOSIS — M5432 Sciatica, left side: Secondary | ICD-10-CM | POA: Insufficient documentation

## 2015-02-28 DIAGNOSIS — Y9389 Activity, other specified: Secondary | ICD-10-CM | POA: Diagnosis not present

## 2015-02-28 DIAGNOSIS — S3992XA Unspecified injury of lower back, initial encounter: Secondary | ICD-10-CM | POA: Diagnosis present

## 2015-02-28 DIAGNOSIS — Z87891 Personal history of nicotine dependence: Secondary | ICD-10-CM | POA: Diagnosis not present

## 2015-02-28 DIAGNOSIS — Z9889 Other specified postprocedural states: Secondary | ICD-10-CM | POA: Diagnosis not present

## 2015-02-28 DIAGNOSIS — S39012A Strain of muscle, fascia and tendon of lower back, initial encounter: Secondary | ICD-10-CM | POA: Diagnosis not present

## 2015-02-28 DIAGNOSIS — Y92096 Garden or yard of other non-institutional residence as the place of occurrence of the external cause: Secondary | ICD-10-CM | POA: Diagnosis not present

## 2015-02-28 DIAGNOSIS — Z8669 Personal history of other diseases of the nervous system and sense organs: Secondary | ICD-10-CM | POA: Insufficient documentation

## 2015-02-28 MED ORDER — METHOCARBAMOL 500 MG PO TABS: 500.0000 mg | ORAL_TABLET | Freq: Two times a day (BID) | ORAL | Status: DC

## 2015-02-28 MED ORDER — CYCLOBENZAPRINE HCL 10 MG PO TABS
5.0000 mg | ORAL_TABLET | Freq: Once | ORAL | Status: AC
  Administered 2015-02-28: 5 mg via ORAL
  Filled 2015-02-28: qty 1

## 2015-02-28 MED ORDER — OXYCODONE-ACETAMINOPHEN 5-325 MG PO TABS
1.0000 | ORAL_TABLET | Freq: Once | ORAL | Status: AC
  Administered 2015-02-28: 1 via ORAL
  Filled 2015-02-28: qty 1

## 2015-02-28 MED ORDER — OXYCODONE-ACETAMINOPHEN 7.5-325 MG PO TABS: 1.0000 | ORAL_TABLET | ORAL | Status: DC | PRN

## 2015-02-28 NOTE — ED Notes (Signed)
Pt here for back pain radiating into left hip, left leg and left foot. sts numbness in left foot. sts started after bending over and he heard a pop.

## 2015-02-28 NOTE — ED Notes (Signed)
Declined W/C at D/C and was escorted to lobby by RN. 

## 2015-02-28 NOTE — Discharge Instructions (Signed)
Call your doctor on Monday for continued pain management

## 2015-02-28 NOTE — ED Provider Notes (Signed)
CSN: [id number]     Arrival date & time 02/28/15  1502 History   First MD Initiated Contact with Patient 02/28/15 1548     Chief Complaint  Patient presents with  . Back Pain     (Consider location/radiation/quality/duration/timing/severity/associated sxs/prior Treatment) Patient is a 39 y.o. male presenting with back pain. The history is provided by the patient.  Back Pain Location:  Lumbar spine Quality:  Shooting Radiates to:  L posterior upper leg Pain severity:  Severe Timing:  Constant Progression:  Worsening Chronicity:  New Relieved by:  Nothing Worsened by:  Movement, standing, bending and ambulation Ineffective treatments:  Being still and cold packs Associated symptoms: leg pain   Associated symptoms: no bladder incontinence and no bowel incontinence     Venetia ConstableGary Bennette is a 39 y.o. male who presents to the ED with hx of low back pain s/p crush injury at work in 2015. Patient states he has plate and screws in his lower back as a result of the injury. He reports that he was playing with his children in the yard yesterday and felt a pop in his lower back followed by severe pain. He applied ice and rested.  Today the pain is worse.  He called his orthopedic doctor and was instructed to come to the ED.   Past Medical History  Diagnosis Date  . Stomach ulcer   . Pancreatitis   . Testicular torsion   . Epididymitis   . Seizures (HCC)     last grand mal was in 06/2012  . 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 Knee arthroscopy      right and left  . Rotator cuff repair      left  . Shoulder arthroscopy      left  . Tonsillectomy    . Palate / uvula biopsy / excision    . Wisdom tooth extraction    . Testicular exploration    . Transforaminal lumbar interbody fusion (tlif) with pedicle screw fixation 1 level  07/17/2014    L 4 L5  . Back surgery     History reviewed. No pertinent family history. Social History  Substance  Use Topics  . Smoking status: Former Games developermoker  . Smokeless tobacco: Current User    Types: Chew  . Alcohol Use: Yes    Review of Systems  Gastrointestinal: Negative for bowel incontinence.  Genitourinary: Negative for bladder incontinence.  Musculoskeletal: Positive for back pain.  all other systems negative    Allergies  Versed  Home Medications   Prior to Admission medications   Medication Sig Start Date End Date Taking? Authorizing Provider  Dexlansoprazole (DEXILANT) 30 MG capsule Take 30 mg by mouth 2 (two) times daily.    Historical Provider, MD  docusate sodium (COLACE) 100 MG capsule Take 1 capsule (100 mg total) by mouth 3 (three) times daily as needed for mild constipation. 07/23/14   Venita Lickahari Brooks, MD  methocarbamol (ROBAXIN) 500 MG tablet Take 1 tablet (500 mg total) by mouth 2 (two) times daily. 02/28/15   Nashayla Telleria Orlene OchM Lissandra Keil, NP  ondansetron (ZOFRAN) 4 MG tablet Take 1 tablet (4 mg total) by mouth every 8 (eight) hours as needed for nausea or vomiting. 07/23/14   Venita Lickahari Brooks, MD  oxyCODONE-acetaminophen (PERCOCET) 7.5-325 MG tablet Take 1 tablet by mouth every 4 (four) hours as needed for severe pain. 02/28/15   Britany Callicott Orlene OchM Griselle Rufer, NP  BP 124/71 mmHg  Pulse 79  Temp(Src) 99.2 F (37.3 C) (Oral)  Resp 20  SpO2 99% Physical Exam  Constitutional: He is oriented to person, place, and time. He appears well-developed and well-nourished. No distress.  HENT:  Head: Normocephalic and atraumatic.  Eyes: EOM are normal.  Neck: Normal range of motion. Neck supple.  Cardiovascular: Normal rate and regular rhythm.   Pulmonary/Chest: Effort normal. No respiratory distress. He has no wheezes. He has no rales.  Abdominal: Soft. Bowel sounds are normal. There is no tenderness.  Musculoskeletal: Normal range of motion. He exhibits no edema.       Lumbar back: He exhibits tenderness. He exhibits normal range of motion, no deformity and normal pulse.       Back:  Tender over left sciatic  nerve.  Neurological: He is alert and oriented to person, place, and time. He has normal strength. No cranial nerve deficit or sensory deficit. Coordination and gait normal.  Reflex Scores:      Bicep reflexes are 2+ on the right side and 2+ on the left side.      Brachioradialis reflexes are 2+ on the right side and 2+ on the left side.      Patellar reflexes are 2+ on the right side and 2+ on the left side.      Achilles reflexes are 2+ on the right side and 2+ on the left side. Skin: Skin is warm and dry.  Psychiatric: He has a normal mood and affect. His behavior is normal.  Nursing note and vitals reviewed.   ED Course  Procedures (including critical care time) Labs Review Labs Reviewed - No data to display  Imaging Review Dg Lumbar Spine Complete  02/28/2015  CLINICAL DATA:  Low back pain EXAM: LUMBAR SPINE - COMPLETE 4+ VIEW COMPARISON:  Lumbar spine x-rays dated 12/26/2014. FINDINGS: Posterior fusion hardware at the L4-5 level appears intact and stable in alignment, with intervening disc spacer. Overall osseous alignment is normal. No fracture line or displaced fracture fragment seen. No acute - appearing cortical irregularity or osseous lesion. Posterior elements remain normally aligned. The mild degenerative changes at the T11-12 and T12-L1 levels is again noted with associated disc space narrowings and mild osseous spurring. No new degenerative change. Sacrum appears intact and well aligned. SI joints remain symmetric. Paravertebral soft tissues are unremarkable. IMPRESSION: 1. Previous L4-L5 fixation. Hardware appears intact and stable in alignment. 2. No acute findings. 3. Stable degenerative changes at the thoracolumbar junction without significant change. Electronically Signed   By: Bary Richard M.D.   On: 02/28/2015 16:38    MDM  38 y.o. male with left lower back pain s/p injury while playing with his children. Stable for d/c without focal neuro deficits and no acute findings  on x-ray. Will treat for pain and muscle spasm and he will follow up with his doctor for continued pain management.   Final diagnoses:  Lumbosacral strain, initial encounter  Sciatica, left       Janne Napoleon, NP 02/28/15 2008  Gilda Crease, MD 03/01/15 [phone]

## 2015-03-02 ENCOUNTER — Ambulatory Visit
Admission: RE | Admit: 2015-03-02 | Discharge: 2015-03-02 | Disposition: A | Discharge status: Home / Self Care | Payer: BC Managed Care – PPO | Source: Ambulatory Visit | Attending: Orthopedic Surgery | Admitting: Orthopedic Surgery

## 2015-03-02 DIAGNOSIS — Z981 Arthrodesis status: Secondary | ICD-10-CM

## 2015-03-26 ENCOUNTER — Emergency Department (HOSPITAL_COMMUNITY): Payer: BC Managed Care – PPO

## 2015-03-26 ENCOUNTER — Emergency Department (HOSPITAL_COMMUNITY)
Admission: EM | Admit: 2015-03-26 | Discharge: 2015-03-26 | Disposition: A | Discharge status: Home / Self Care | Payer: BC Managed Care – PPO | Attending: Emergency Medicine | Admitting: Emergency Medicine

## 2015-03-26 ENCOUNTER — Encounter (HOSPITAL_COMMUNITY): Payer: Self-pay | Admitting: Cardiology

## 2015-03-26 DIAGNOSIS — W19XXXA Unspecified fall, initial encounter: Secondary | ICD-10-CM

## 2015-03-26 DIAGNOSIS — S3991XA Unspecified injury of abdomen, initial encounter: Secondary | ICD-10-CM | POA: Insufficient documentation

## 2015-03-26 DIAGNOSIS — Y9339 Activity, other involving climbing, rappelling and jumping off: Secondary | ICD-10-CM | POA: Insufficient documentation

## 2015-03-26 DIAGNOSIS — Y9289 Other specified places as the place of occurrence of the external cause: Secondary | ICD-10-CM | POA: Insufficient documentation

## 2015-03-26 DIAGNOSIS — W108XXA Fall (on) (from) other stairs and steps, initial encounter: Secondary | ICD-10-CM | POA: Diagnosis not present

## 2015-03-26 DIAGNOSIS — M1990 Unspecified osteoarthritis, unspecified site: Secondary | ICD-10-CM | POA: Insufficient documentation

## 2015-03-26 DIAGNOSIS — S300XXA Contusion of lower back and pelvis, initial encounter: Secondary | ICD-10-CM | POA: Insufficient documentation

## 2015-03-26 DIAGNOSIS — S20229A Contusion of unspecified back wall of thorax, initial encounter: Secondary | ICD-10-CM

## 2015-03-26 DIAGNOSIS — Z87891 Personal history of nicotine dependence: Secondary | ICD-10-CM | POA: Diagnosis not present

## 2015-03-26 DIAGNOSIS — Z87438 Personal history of other diseases of male genital organs: Secondary | ICD-10-CM | POA: Insufficient documentation

## 2015-03-26 DIAGNOSIS — Z79899 Other long term (current) drug therapy: Secondary | ICD-10-CM | POA: Insufficient documentation

## 2015-03-26 DIAGNOSIS — Y998 Other external cause status: Secondary | ICD-10-CM | POA: Diagnosis not present

## 2015-03-26 DIAGNOSIS — Z8719 Personal history of other diseases of the digestive system: Secondary | ICD-10-CM | POA: Diagnosis not present

## 2015-03-26 DIAGNOSIS — S3992XA Unspecified injury of lower back, initial encounter: Secondary | ICD-10-CM | POA: Diagnosis present

## 2015-03-26 MED ORDER — OXYCODONE-ACETAMINOPHEN 7.5-325 MG PO TABS: 1.0000 | ORAL_TABLET | ORAL | Status: DC | PRN

## 2015-03-26 MED ORDER — OXYCODONE-ACETAMINOPHEN 5-325 MG PO TABS: 1.0000 | ORAL_TABLET | ORAL | Status: DC | PRN

## 2015-03-26 MED ORDER — HYDROMORPHONE HCL 1 MG/ML IJ SOLN
1.0000 mg | Freq: Once | INTRAMUSCULAR | Status: AC
  Administered 2015-03-26: 1 mg via INTRAVENOUS
  Filled 2015-03-26: qty 1

## 2015-03-26 MED ORDER — IOHEXOL 300 MG/ML  SOLN
80.0000 mL | Freq: Once | INTRAMUSCULAR | Status: AC | PRN
  Administered 2015-03-26: 80 mL via INTRAVENOUS

## 2015-03-26 NOTE — ED Notes (Signed)
Patient transported to CT 

## 2015-03-26 NOTE — ED Notes (Signed)
MD at bedside. 

## 2015-03-26 NOTE — ED Notes (Addendum)
Pt reports he was climbing down out of a tree stand and missed step falling a couple of feet. Had lower back surgery back in March, and has hardware in his back. Reports some numbness in the left foot.

## 2015-03-26 NOTE — Discharge Instructions (Signed)

## 2015-03-26 NOTE — ED Provider Notes (Signed)
CSN: [id number]     Arrival date & time 03/26/15  1251 History   None    Chief Complaint  Patient presents with  . Back Pain     (Consider location/radiation/quality/duration/timing/severity/associated sxs/prior Treatment) HPI   Pt with hx lumbar fusion surgery by Dr Shon BatonBrooks  In 07/2014 presents with severe pain in his lumbar spine after falling on his back, missing the last three steps coming down from a tree stand.  States he fell because he was excited about shooting a deer.  Has chronic ongoing numbness in his left foot that is unchanged.  Does have pain in his bilateral forearms and hands described as feeling "like someone grabbing me."  Also admits to LLQ abdominal pain.  Denies headache, LOC, neck pain, CP, new weakness or numbness of the arms and legs.    Past Medical History  Diagnosis Date  . Stomach ulcer   . Pancreatitis   . Testicular torsion   . Epididymitis   . Seizures (HCC)     last grand mal was in 06/2012  . 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 Knee arthroscopy      right and left  . Rotator cuff repair      left  . Shoulder arthroscopy      left  . Tonsillectomy    . Palate / uvula biopsy / excision    . Wisdom tooth extraction    . Testicular exploration    . Transforaminal lumbar interbody fusion (tlif) with pedicle screw fixation 1 level  07/17/2014    L 4 L5  . Back surgery     History reviewed. No pertinent family history. Social History  Substance Use Topics  . Smoking status: Former Games developermoker  . Smokeless tobacco: Current User    Types: Chew  . Alcohol Use: Yes    Review of Systems  Constitutional: Negative for fever.  Respiratory: Negative for shortness of breath.   Cardiovascular: Negative for chest pain.  Gastrointestinal: Positive for abdominal pain. Negative for vomiting.  Musculoskeletal: Positive for back pain. Negative for neck pain.  Skin: Negative for color change and wound.    Allergic/Immunologic: Negative for immunocompromised state.  Neurological: Positive for numbness. Negative for syncope, weakness and headaches.  Hematological: Does not bruise/bleed easily.  Psychiatric/Behavioral: Negative for self-injury.      Allergies  Versed  Home Medications   Prior to Admission medications   Medication Sig Start Date End Date Taking? Authorizing Provider  Dexlansoprazole (DEXILANT) 30 MG capsule Take 30 mg by mouth 2 (two) times daily.    Historical Provider, MD  docusate sodium (COLACE) 100 MG capsule Take 1 capsule (100 mg total) by mouth 3 (three) times daily as needed for mild constipation. 07/23/14   Venita Lickahari Brooks, MD  methocarbamol (ROBAXIN) 500 MG tablet Take 1 tablet (500 mg total) by mouth 2 (two) times daily. 02/28/15   Hope Orlene OchM Neese, NP  ondansetron (ZOFRAN) 4 MG tablet Take 1 tablet (4 mg total) by mouth every 8 (eight) hours as needed for nausea or vomiting. 07/23/14   Venita Lickahari Brooks, MD  oxyCODONE-acetaminophen (PERCOCET) 7.5-325 MG tablet Take 1 tablet by mouth every 4 (four) hours as needed for severe pain. 02/28/15   Hope Orlene OchM Neese, NP   BP 151/102 mmHg  Pulse 130  Temp(Src) 99.9 F (37.7 C) (Oral)  Resp 28  Ht 5\' 11"  (1.803 m)  Wt 104.327 kg  BMI  32.09 kg/m2  SpO2 97% Physical Exam  Constitutional: He appears well-developed and well-nourished. No distress.  HENT:  Head: Normocephalic and atraumatic.  Neck: Neck supple.  Cardiovascular: Intact distal pulses.   Pulmonary/Chest: Effort normal.  Abdominal: Soft. Bowel sounds are normal. There is tenderness in the left lower quadrant. There is no rebound and no guarding.    Musculoskeletal:       Back:  Moves arms and legs equally.  Sensation intact.    Neurological: He is alert.  Skin: He is not diaphoretic.  Nursing note and vitals reviewed.   ED Course  Procedures (including critical care time) Labs Review Labs Reviewed - No data to display  Imaging Review No results found. I  have personally reviewed and evaluated these images and lab results as part of my medical decision-making.   EKG Interpretation None      MDM   Final diagnoses:  None    Nontoxic patient in significant pain after falling off a ladder while climbing down too fast, falling directly on his back.  Significant pain in the lumbar spine and some LLQ abdominal pain, abnormal sensation of the bilateral forearms but no trauma to this area.  Discussed pt with Dr Juleen China who assumes care of patient pending imaging results.      Trixie Dredge, PA-C 03/26/15 1605  Margarita Grizzle, MD 04/04/15 [phone]

## 2015-03-26 NOTE — ED Notes (Signed)
PA at bedside.

## 2015-03-28 ENCOUNTER — Encounter (HOSPITAL_COMMUNITY): Payer: Self-pay | Admitting: Nurse Practitioner

## 2015-03-28 ENCOUNTER — Emergency Department (HOSPITAL_COMMUNITY)
Admission: EM | Admit: 2015-03-28 | Discharge: 2015-03-28 | Disposition: A | Discharge status: Home / Self Care | Payer: BC Managed Care – PPO | Attending: Emergency Medicine | Admitting: Emergency Medicine

## 2015-03-28 DIAGNOSIS — Z87828 Personal history of other (healed) physical injury and trauma: Secondary | ICD-10-CM | POA: Insufficient documentation

## 2015-03-28 DIAGNOSIS — M545 Low back pain, unspecified: Secondary | ICD-10-CM

## 2015-03-28 DIAGNOSIS — M1990 Unspecified osteoarthritis, unspecified site: Secondary | ICD-10-CM | POA: Diagnosis not present

## 2015-03-28 DIAGNOSIS — Z87891 Personal history of nicotine dependence: Secondary | ICD-10-CM | POA: Diagnosis not present

## 2015-03-28 DIAGNOSIS — Z79899 Other long term (current) drug therapy: Secondary | ICD-10-CM | POA: Diagnosis not present

## 2015-03-28 DIAGNOSIS — Z87438 Personal history of other diseases of male genital organs: Secondary | ICD-10-CM | POA: Diagnosis not present

## 2015-03-28 DIAGNOSIS — Z8719 Personal history of other diseases of the digestive system: Secondary | ICD-10-CM | POA: Diagnosis not present

## 2015-03-28 MED ORDER — HYDROCODONE-ACETAMINOPHEN 5-325 MG PO TABS
1.0000 | ORAL_TABLET | Freq: Once | ORAL | Status: AC
  Administered 2015-03-28: 1 via ORAL
  Filled 2015-03-28: qty 1

## 2015-03-28 MED ORDER — KETOROLAC TROMETHAMINE 30 MG/ML IJ SOLN
30.0000 mg | Freq: Once | INTRAMUSCULAR | Status: AC
  Administered 2015-03-28: 30 mg via INTRAMUSCULAR
  Filled 2015-03-28: qty 1

## 2015-03-28 NOTE — ED Notes (Signed)
Pt is in stable condition upon d/c and ambulates from ED. 

## 2015-03-28 NOTE — ED Notes (Addendum)
He was outside hunting on Thursday and his leg gave out and he fell onto ground. He has history of spinal surgery and has had episodes of "leg giving out" since. He c/o L lower back, hip pain since. he's tried ice and heat with no relief. He did not take any meds at home, he has history stomach ulcers so does not like to take NSAIDs. He was here Thursday for this same complaint, has been unable to schedule f/u appt yet due to holidays but pain persists

## 2015-03-28 NOTE — ED Provider Notes (Signed)
CSN: [id number]     Arrival date & time 03/28/15  1145 History   First MD Initiated Contact with Patient 03/28/15 1306     Chief Complaint  Patient presents with  . Fall   HPI  39 year old male presents today with acute on chronic back pain. Patient reports that he has a long-standing history of low back pain from a work-related injury with resulting lumbar surgery. He reports that he has chronic back pain at baseline but recently was climbing down a tree stand 3 days prior when he slipped falling and landing on his back exacerbating his chronic back pain. Patient reports that he was seen here in the ED and discharged home with pain medication. He reports pain medication that he was given temporary relieves the back pain does not completely resolve it. Patient reports that he has several episodes of back pain exacerbation identical to this, he denies any loss of distal sensation and strength or motor function. He reports he is able to ambulate but does have some pain to the left lower lumbar region with ambulation. She denies any back pain red flags.  Past Medical History  Diagnosis Date  . Stomach ulcer   . Pancreatitis   . Testicular torsion   . Epididymitis   . Seizures (HCC)     last grand mal was in 06/2012  . 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 Knee arthroscopy      right and left  . Rotator cuff repair      left  . Shoulder arthroscopy      left  . Tonsillectomy    . Palate / uvula biopsy / excision    . Wisdom tooth extraction    . Testicular exploration    . Transforaminal lumbar interbody fusion (tlif) with pedicle screw fixation 1 level  07/17/2014    L 4 L5  . Back surgery     History reviewed. No pertinent family history. Social History  Substance Use Topics  . Smoking status: Former Games developer  . Smokeless tobacco: Current User    Types: Chew  . Alcohol Use: Yes    Review of Systems  All other systems reviewed and  are negative.   Allergies  Versed and Anti-inflammatory enzyme  Home Medications   Prior to Admission medications   Medication Sig Start Date End Date Taking? Authorizing Provider  docusate sodium (COLACE) 100 MG capsule Take 1 capsule (100 mg total) by mouth 3 (three) times daily as needed for mild constipation. Patient not taking: Reported on 03/26/2015 07/23/14   Venita Lick, MD  gabapentin (NEURONTIN) 300 MG capsule Take 300 mg by mouth 3 (three) times daily as needed. Nerve pain 12/05/14 12/05/15  Historical Provider, MD  methocarbamol (ROBAXIN) 500 MG tablet Take 1 tablet (500 mg total) by mouth 2 (two) times daily. Patient taking differently: Take 500 mg by mouth 2 (two) times daily as needed for muscle spasms.  02/28/15   Hope Orlene Och, NP  ondansetron (ZOFRAN) 4 MG tablet Take 1 tablet (4 mg total) by mouth every 8 (eight) hours as needed for nausea or vomiting. Patient not taking: Reported on 03/26/2015 07/23/14   Venita Lick, MD  oxyCODONE-acetaminophen (PERCOCET) 7.5-325 MG tablet Take 1 tablet by mouth every 4 (four) hours as needed for severe pain. 03/26/15   Raeford Razor, MD  oxyCODONE-acetaminophen (PERCOCET/ROXICET) 5-325 MG tablet Take 1 tablet by mouth every 4 (four)  hours as needed for severe pain. 03/26/15   Raeford Razor, MD  traMADol (ULTRAM) 50 MG tablet Take 50 mg by mouth 3 (three) times daily. 03/05/15   Historical Provider, MD  traZODone (DESYREL) 100 MG tablet Take 200 mg by mouth at bedtime. 12/05/14   Historical Provider, MD   BP 128/81 mmHg  Pulse 89  Temp(Src) 98.8 F (37.1 C) (Oral)  Resp 18  SpO2 98%   Physical Exam  Constitutional: He is oriented to person, place, and time. He appears well-developed and well-nourished. No distress.  HENT:  Head: Normocephalic and atraumatic.  Eyes: Conjunctivae are normal. Pupils are equal, round, and reactive to light. Right eye exhibits no discharge. Left eye exhibits no discharge. No scleral icterus.  Neck: Normal  range of motion. Neck supple. No JVD present. No tracheal deviation present.  Pulmonary/Chest: Effort normal. No stridor.  Abdominal: Soft. He exhibits no distension and no mass. There is no tenderness. There is no rebound and no guarding.  Musculoskeletal: Normal range of motion. He exhibits tenderness. He exhibits no edema.  No C, T, or L spine tenderness to palpation. No obvious signs of trauma, deformity, infection, step-offs. Lung expansion normal. No scoliosis or kyphosis. Bilateral lower extremity strength 5 out of 5, sensation grossly intact, pedal pulses 2+, Refill less than 3 seconds.  Minor tenderness to palpation of the left lower lumbar soft tissue Straight leg negative  Ambulates with minimal difficulty   Neurological: He is alert and oriented to person, place, and time. Coordination normal.  Skin: Skin is warm and dry. He is not diaphoretic.  Psychiatric: He has a normal mood and affect. His behavior is normal. Judgment and thought content normal.  Nursing note and vitals reviewed.   ED Course  Procedures (including critical care time) Labs Review Labs Reviewed - No data to display  Imaging Review Ct Cervical Spine Wo Contrast  03/26/2015  CLINICAL DATA:  Fall EXAM: CT CERVICAL SPINE WITHOUT CONTRAST TECHNIQUE: Multidetector CT imaging of the cervical spine was performed without intravenous contrast. Multiplanar CT image reconstructions were also generated. COMPARISON:  None. FINDINGS: No fracture. No dislocation. No obvious spinal hematoma. No obvious soft tissue injury. IMPRESSION: No acute bony injury in the cervical spine. Electronically Signed   By: Jolaine Click M.D.   On: 03/26/2015 17:26   Ct Abdomen Pelvis W Contrast  03/26/2015  CLINICAL DATA:  Fall EXAM: CT ABDOMEN AND PELVIS WITH CONTRAST TECHNIQUE: Multidetector CT imaging of the abdomen and pelvis was performed using the standard protocol following bolus administration of intravenous contrast. CONTRAST:  80mL  OMNIPAQUE IOHEXOL 300 MG/ML  SOLN COMPARISON:  03/02/2015 FINDINGS: Liver, gallbladder, spleen, pancreas, adrenal glands are within normal limits. Hyperdense foci in the collecting system of both kidneys either represents injected contrast or calculi. Normal appendix.  Bladder and prostate are unremarkable. No free-fluid. No hemoperitoneum. No retroperitoneal hemorrhage or adenopathy. Bilateral pedicle screws with cross stabilizing bars are present at L4 and L5 with a disc spacer. Hardware is intact. Hardware is stable in position. There is no vertebral compression deformity. IMPRESSION: No acute injury in the abdomen or pelvis Possible nephrolithiasis. Stable L4-5 fusion without evidence of breakage of the hardware or fracture. Electronically Signed   By: Jolaine Click M.D.   On: 03/26/2015 17:33   I have personally reviewed and evaluated these images and lab results as part of my medical decision-making.   EKG Interpretation None      MDM   Final diagnoses:  Left-sided low  back pain without sciatica    Labs:  Imaging:  Consults:  Therapeutics: Toradol, hydrocodone  Discharge Meds:   Assessment/Plan: She will presents with acute on chronic lower back pain. He has no signs of trauma, no bony tenderness, no neurological deficits or any red flags for back pain. Patient was given a dose of hydrocodone here in the ED, Toradol, and instructed to follow-up with his back specialist on Monday for further evaluation and management. Patient was given strict return precautions, verbalized understanding and agreement for today's plan had no further questions or concerns at time of discharge.         Eyvonne MechanicJeffrey Icis Budreau, PA-C 03/28/15 1408  Leta BaptistEmily Roe Nguyen, MD 03/29/15 1055

## 2015-03-28 NOTE — Discharge Instructions (Signed)

## 2015-03-28 NOTE — ED Notes (Signed)
Chronic back pain of a 4/10. Pt states he fell about 4 ft out of a deer stand onto the ground on Thursday. Pt states orthopedics directed him to return to the ED rt inability to control pain.

## 2015-06-29 ENCOUNTER — Encounter (HOSPITAL_COMMUNITY): Payer: Self-pay | Admitting: *Deleted

## 2015-06-29 ENCOUNTER — Emergency Department (HOSPITAL_COMMUNITY)
Admission: EM | Admit: 2015-06-29 | Discharge: 2015-06-29 | Disposition: A | Discharge status: Home / Self Care | Payer: BC Managed Care – PPO | Attending: Emergency Medicine | Admitting: Emergency Medicine

## 2015-06-29 DIAGNOSIS — S29002A Unspecified injury of muscle and tendon of back wall of thorax, initial encounter: Secondary | ICD-10-CM | POA: Insufficient documentation

## 2015-06-29 DIAGNOSIS — X501XXA Overexertion from prolonged static or awkward postures, initial encounter: Secondary | ICD-10-CM | POA: Insufficient documentation

## 2015-06-29 DIAGNOSIS — Y9289 Other specified places as the place of occurrence of the external cause: Secondary | ICD-10-CM | POA: Insufficient documentation

## 2015-06-29 DIAGNOSIS — Y998 Other external cause status: Secondary | ICD-10-CM | POA: Insufficient documentation

## 2015-06-29 DIAGNOSIS — Y9389 Activity, other specified: Secondary | ICD-10-CM | POA: Insufficient documentation

## 2015-06-29 DIAGNOSIS — Z87891 Personal history of nicotine dependence: Secondary | ICD-10-CM | POA: Insufficient documentation

## 2015-06-29 DIAGNOSIS — S3992XA Unspecified injury of lower back, initial encounter: Secondary | ICD-10-CM | POA: Insufficient documentation

## 2015-06-29 DIAGNOSIS — Z87438 Personal history of other diseases of male genital organs: Secondary | ICD-10-CM | POA: Insufficient documentation

## 2015-06-29 DIAGNOSIS — Z8719 Personal history of other diseases of the digestive system: Secondary | ICD-10-CM | POA: Insufficient documentation

## 2015-06-29 DIAGNOSIS — R209 Unspecified disturbances of skin sensation: Secondary | ICD-10-CM

## 2015-06-29 DIAGNOSIS — M1990 Unspecified osteoarthritis, unspecified site: Secondary | ICD-10-CM | POA: Insufficient documentation

## 2015-06-29 DIAGNOSIS — Z79899 Other long term (current) drug therapy: Secondary | ICD-10-CM | POA: Insufficient documentation

## 2015-06-29 DIAGNOSIS — M5410 Radiculopathy, site unspecified: Secondary | ICD-10-CM

## 2015-06-29 MED ORDER — HYDROMORPHONE HCL 1 MG/ML IJ SOLN: 1.0000 mg | Freq: Once | INTRAMUSCULAR | Status: DC

## 2015-06-29 MED ORDER — KETOROLAC TROMETHAMINE 60 MG/2ML IM SOLN
60.0000 mg | Freq: Once | INTRAMUSCULAR | Status: AC
  Administered 2015-06-29: 60 mg via INTRAMUSCULAR
  Filled 2015-06-29: qty 2

## 2015-06-29 MED ORDER — PREDNISONE 20 MG PO TABS: 40.0000 mg | ORAL_TABLET | Freq: Every day | ORAL | Status: DC

## 2015-06-29 MED ORDER — IBUPROFEN 800 MG PO TABS: 800.0000 mg | ORAL_TABLET | Freq: Three times a day (TID) | ORAL | Status: DC

## 2015-06-29 MED ORDER — OXYCODONE-ACETAMINOPHEN 5-325 MG PO TABS: 1.0000 | ORAL_TABLET | ORAL | Status: DC | PRN

## 2015-06-29 MED ORDER — KETOROLAC TROMETHAMINE 30 MG/ML IJ SOLN: 30.0000 mg | Freq: Once | INTRAMUSCULAR | Status: DC

## 2015-06-29 MED ORDER — DEXAMETHASONE SODIUM PHOSPHATE 10 MG/ML IJ SOLN: 10.0000 mg | Freq: Once | INTRAMUSCULAR | Status: DC

## 2015-06-29 MED ORDER — DEXAMETHASONE SODIUM PHOSPHATE 10 MG/ML IJ SOLN
10.0000 mg | Freq: Once | INTRAMUSCULAR | Status: AC
  Administered 2015-06-29: 10 mg via INTRAMUSCULAR
  Filled 2015-06-29: qty 1

## 2015-06-29 MED ORDER — OXYCODONE-ACETAMINOPHEN 5-325 MG PO TABS
1.0000 | ORAL_TABLET | Freq: Once | ORAL | Status: AC
  Administered 2015-06-29: 1 via ORAL
  Filled 2015-06-29: qty 1

## 2015-06-29 NOTE — Discharge Instructions (Signed)
Lumbosacral Radiculopathy °Lumbosacral radiculopathy is a condition that involves the spinal nerves and nerve roots in the low back and bottom of the spine. The condition develops when these nerves and nerve roots move out of place or become inflamed and cause symptoms. °CAUSES °This condition may be caused by: °· Pressure from a disk that bulges out of place (herniated disk). A disk is a plate of cartilage that separates bones in the spine. °· Disk degeneration. °· A narrowing of the bones of the lower back (spinal stenosis). °· A tumor. °· An infection. °· An injury that places sudden pressure on the disks that cushion the bones of your lower spine. °RISK FACTORS °This condition is more likely to develop in: °· Males aged 30-50 years. °· Females aged 50-60 years. °· People who lift improperly. °· People who are overweight or live a sedentary lifestyle. °· People who smoke. °· People who perform repetitive activities that strain the spine. °SYMPTOMS °Symptoms of this condition include: °· Pain that goes down from the back into the legs (sciatica). This is the most common symptom. The pain may be worse with sitting, coughing, or sneezing. °· Pain and numbness in the arms and legs. °· Muscle weakness. °· Tingling. °· Loss of bladder control or bowel control. °DIAGNOSIS °This condition is diagnosed with a physical exam and medical history. If the pain is lasting, you may have tests, such as: °· MRI scan. °· X-ray. °· CT scan. °· Myelogram. °· Nerve conduction study. °TREATMENT °This condition is often treated with: °· Hot packs and ice applied to affected areas. °· Stretches to improve flexibility. °· Exercises to strengthen back muscles. °· Physical therapy. °· Pain medicine. °· A steroid injection in the spine. °In some cases, no treatment is needed. If the condition is long-lasting (chronic), or if symptoms are severe, treatment may involve surgery or lifestyle changes, such as following a weight loss plan. °HOME  CARE INSTRUCTIONS °Medicines °· Take medicines only as directed by your health care provider. °· Do not drive or operate heavy machinery while taking pain medicine. °Injury Care °· Apply a heat pack to the injured area as directed by your health care provider. °· Apply ice to the affected area: °¨ Put ice in a plastic bag. °¨ Place a towel between your skin and the bag. °¨ Leave the ice on for 20-30 minutes, every 2 hours while you are awake or as needed. Or, leave the ice on for as long as directed by your health care provider. °Other Instructions °· If you were shown how to do any exercises or stretches, do them as directed by your health care provider. °· If your health care provider prescribed a diet or exercise program, follow it as directed. °· Keep all follow-up visits as directed by your health care provider. This is important. °SEEK MEDICAL CARE IF: °· Your pain does not improve over time even when taking pain medicines. °SEEK IMMEDIATE MEDICAL CARE IF: °· Your develop severe pain. °· Your pain suddenly gets worse. °· You develop increasing weakness in your legs. °· You lose the ability to control your bladder or bowel. °· You have difficulty walking or balancing. °· You have a fever. °  °This information is not intended to replace advice given to you by your health care provider. Make sure you discuss any questions you have with your health care provider. °  °Document Released: 04/18/2005 Document Revised: 09/02/2014 Document Reviewed: 04/14/2014 °Elsevier Interactive Patient Education ©2016 [employer] ° °

## 2015-06-29 NOTE — ED Notes (Signed)
Pt has hx of back surgery r/t injury at work several years ago.  States he's been cleared by Dr Shon Baton and he's supposed to have his workman's comp compensation.  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  Pt able to ambulate to triage room.

## 2015-06-29 NOTE — ED Provider Notes (Signed)
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 He reports that while he was bending and lifting today he developed acute onset of pain, heard a pop, he has had pain since that time in his lower back with some radiation down his leg as well as some numbness in the left foot going into the small toe and the great toe on the lateral and medial surfaces. On exam in fact he does have some slight decreased sensation in those areas, he is able to fully extend and dorsiflex the great toe, he has normal plantar and dorsiflexion at the ankle and normal reflexes at the patellar tendon. The patient likely has a radiculopathy, he can follow-up in the outpatient setting for an MRI at the symptoms do not improve after the medications he has been given here. I have given him verbal instructions including precautions for which she should return and he has expressed his understanding.  Medical screening examination/treatment/procedure(s) were conducted as a shared visit with non-physician practitioner(s) and myself.  I personally evaluated the patient during the encounter.  Clinical Impression:   Final diagnoses:  Radicular pain  Altered sensation of foot         Eber Hong, MD 06/30/15 0005

## 2015-06-29 NOTE — ED Provider Notes (Signed)
CSN: [id number]     Arrival date & time 06/29/15  1818 History  By signing my name below, I, Gonzella Lex, attest that this documentation has been prepared under the direction and in the presence of Cheri Fowler, PA-C. Electronically Signed: Gonzella Lex, Scribe. 06/29/2015. 8:03 PM.  Chief Complaint  Patient presents with  . Back Pain  . Numbness   The history is provided by the patient. No language interpreter was used.   HPI Comments: Ralph Fowler is a 39 y.o. male with a hx of lumbar fusion surgery performed by Dr. 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 Pt fell out after his back popped but denies head injury or LOC.  Pt denies bowel and bladder incontinence, perianal numbness, abdominal pain, IVDU and hx of cancer. Pt does not regularly take medication for his chronic back pain.     Past Medical History  Diagnosis Date  . Stomach ulcer   . Pancreatitis   . Testicular torsion   . Epididymitis   . Seizures (HCC)     last grand mal was in 06/2012  . 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 Knee arthroscopy      right and left  . Rotator cuff repair      left  . Shoulder arthroscopy      left  . Tonsillectomy    . Palate / uvula biopsy / excision    . Wisdom tooth extraction    . Testicular exploration    . Transforaminal lumbar interbody fusion (tlif) with pedicle screw fixation 1 level  07/17/2014    L 4 L5  . Back surgery     No family history on file. Social History  Substance Use Topics  . Smoking status: Former Games developer  . Smokeless tobacco: Current User    Types: Chew  . Alcohol Use: Yes    Review of Systems   Constitutional: Negative for fever and chills.  Gastrointestinal: Negative for abdominal pain.  Genitourinary:       Negative for bowel and bladder incontinence.  Musculoskeletal: Positive for back pain.  Neurological: Positive for numbness ( tingling).  All other systems reviewed and are negative.  Allergies  Versed and Anti-inflammatory enzyme  Home Medications   Prior to Admission medications   Medication Sig Start Date End Date Taking? Authorizing Provider  docusate sodium (COLACE) 100 MG capsule Take 1 capsule (100 mg total) by mouth 3 (three) times daily as needed for mild constipation. Patient not taking: Reported on 03/26/2015 07/23/14   Venita Lick, MD  gabapentin (NEURONTIN) 300 MG capsule Take 300 mg by mouth 3 (three) times daily as needed. Nerve pain 12/05/14 12/05/15  Historical Provider, MD  methocarbamol (ROBAXIN) 500 MG tablet Take 1 tablet (500 mg total) by mouth 2 (two) times daily. Patient taking differently: Take 500 mg by mouth 2 (two) times daily as needed for muscle spasms.  02/28/15   Hope Orlene Och, NP  ondansetron (ZOFRAN) 4 MG tablet Take 1 tablet (4 mg total) by mouth every 8 (eight) hours as needed for nausea or vomiting. Patient not taking:  Reported on 03/26/2015 07/23/14   Venita Lick, MD  oxyCODONE-acetaminophen (PERCOCET) 7.5-325 MG tablet Take 1 tablet by mouth every 4 (four) hours as needed for severe pain. 03/26/15   Raeford Razor, MD  oxyCODONE-acetaminophen (PERCOCET/ROXICET) 5-325 MG tablet Take 1 tablet by mouth every 4 (four) hours as needed for severe pain. 03/26/15   Raeford Razor, MD  traMADol (ULTRAM) 50 MG tablet Take 50 mg by mouth 3 (three) times daily. 03/05/15   Historical Provider, MD  traZODone (DESYREL) 100 MG tablet Take 200 mg by mouth at bedtime. 12/05/14   Historical Provider, MD   BP 144/101 mmHg  Pulse 94  Temp(Src) 99.5 F (37.5 C) (Oral)  Resp 16  SpO2 100% Physical Exam  Constitutional: He is oriented to person, place, and  time. He appears well-developed and well-nourished.  HENT:  Head: Atraumatic.  Eyes: Conjunctivae are normal.  Cardiovascular: Normal rate, regular rhythm, normal heart sounds and intact distal pulses.   Pulses:      Dorsalis pedis pulses are 2+ on the right side, and 2+ on the left side.  Pulmonary/Chest: Effort normal and breath sounds normal.  Abdominal: Soft. Bowel sounds are normal. He exhibits no distension. There is no tenderness.  Musculoskeletal: He exhibits tenderness.  Lumbar spinous process tenderness.  No step offs. No crepitus.  Neurological: He is alert and oriented to person, place, and time. He has normal reflexes.  No saddle anesthesia. Strength intact in dorsi/plantar flexion of left great toe and ankle.  Decreased sensation to left 5th and 4th digits; otherwise, normal.  Skin: Skin is warm and dry.  Psychiatric: He has a normal mood and affect. His behavior is normal.    ED Course  Procedures  DIAGNOSTIC STUDIES:    Oxygen Saturation is 100% on RA, normal by my interpretation.   COORDINATION OF CARE:  8:02 PM Will administer pt pain medication in the ED. Will order an MRI of pt's back. Discussed treatment plan with pt at bedside and pt agreed to plan.   Imaging Review No results found. I have personally reviewed and evaluated these images as part of my medical decision-making.  MDM   Final diagnoses:  Radicular pain  Altered sensation of foot     Patient presents with radicular sxs likely related to HNP.  Case has been discussed with and seen by Dr. Hyacinth Meeker. VSS, NAD.  BP elevated likely secondary to pain. No b/b incontinence or perianal numbness. Decreased sensation to left 4th and 5th digits.  Strength intact.  Intact distal pulses.  Intact reflexes.  Patient given toradol, decadron, and percocet in ED.  Plan to discharge home with prednisone and percocet.  Follow up with PCP or Dr. Shon Baton.  Discussed return precautions.  Patient agrees and acknowledges  the above plan for discharge.   I personally performed the services described in this documentation, which was scribed in my presence. The recorded information has been reviewed and is accurate.   Cheri Fowler, PA-C 06/29/15 2121  Eber Hong, MD 06/30/15 [phone]

## 2015-06-29 NOTE — ED Notes (Signed)
Pt stable, ambulatory, states understanding of discharge instructions 

## 2015-08-22 ENCOUNTER — Emergency Department (HOSPITAL_COMMUNITY)
Admission: EM | Admit: 2015-08-22 | Discharge: 2015-08-22 | Disposition: A | Discharge status: Home / Self Care | Payer: Self-pay | Attending: Emergency Medicine | Admitting: Emergency Medicine

## 2015-08-22 ENCOUNTER — Encounter (HOSPITAL_COMMUNITY): Payer: Self-pay | Admitting: Emergency Medicine

## 2015-08-22 DIAGNOSIS — Z7952 Long term (current) use of systemic steroids: Secondary | ICD-10-CM | POA: Insufficient documentation

## 2015-08-22 DIAGNOSIS — Z8719 Personal history of other diseases of the digestive system: Secondary | ICD-10-CM | POA: Insufficient documentation

## 2015-08-22 DIAGNOSIS — Z791 Long term (current) use of non-steroidal anti-inflammatories (NSAID): Secondary | ICD-10-CM | POA: Insufficient documentation

## 2015-08-22 DIAGNOSIS — M5442 Lumbago with sciatica, left side: Secondary | ICD-10-CM | POA: Insufficient documentation

## 2015-08-22 DIAGNOSIS — M1990 Unspecified osteoarthritis, unspecified site: Secondary | ICD-10-CM | POA: Insufficient documentation

## 2015-08-22 DIAGNOSIS — Z87438 Personal history of other diseases of male genital organs: Secondary | ICD-10-CM | POA: Insufficient documentation

## 2015-08-22 DIAGNOSIS — Z87891 Personal history of nicotine dependence: Secondary | ICD-10-CM | POA: Insufficient documentation

## 2015-08-22 DIAGNOSIS — Z79899 Other long term (current) drug therapy: Secondary | ICD-10-CM | POA: Insufficient documentation

## 2015-08-22 MED ORDER — OXYCODONE-ACETAMINOPHEN 5-325 MG PO TABS
1.0000 | ORAL_TABLET | Freq: Once | ORAL | Status: AC
  Administered 2015-08-22: 1 via ORAL
  Filled 2015-08-22: qty 1

## 2015-08-22 MED ORDER — DEXAMETHASONE SODIUM PHOSPHATE 10 MG/ML IJ SOLN
10.0000 mg | Freq: Once | INTRAMUSCULAR | Status: AC
  Administered 2015-08-22: 10 mg via INTRAMUSCULAR
  Filled 2015-08-22: qty 1

## 2015-08-22 MED ORDER — KETOROLAC TROMETHAMINE 60 MG/2ML IM SOLN
60.0000 mg | Freq: Once | INTRAMUSCULAR | Status: AC
  Administered 2015-08-22: 60 mg via INTRAMUSCULAR
  Filled 2015-08-22: qty 2

## 2015-08-22 MED ORDER — PREDNISONE 20 MG PO TABS: 40.0000 mg | ORAL_TABLET | Freq: Every day | ORAL | Status: DC

## 2015-08-22 NOTE — ED Notes (Signed)
Pt departed in NAD.  

## 2015-08-22 NOTE — Discharge Instructions (Signed)
Please read and follow all provided instructions.  Your diagnoses today include:  1. Left-sided low back pain with left-sided sciatica    Tests performed today include:  Vital signs - see below for your results today  Medications prescribed:   Take any prescribed medications only as directed.  You can use Ibuprofen 400mg  combined with Tylenol 1000mg  for pain relief every 6 hours. Do not exceed 4g of Tylenol in one 24 hour period.    Home care instructions:   Follow any educational materials contained in this packet  Please rest, use ice or heat on your back for the next several days  Do not lift, push, pull anything more than 10 pounds for the next week  Follow-up instructions: Please follow-up with your primary care provider in the next 1 week for further evaluation of your symptoms.   Return instructions:  SEEK IMMEDIATE MEDICAL ATTENTION IF YOU HAVE:  New numbness, tingling, weakness, or problem with the use of your arms or legs  Severe back pain not relieved with medications  Loss control of your bowels or bladder  Increasing pain in any areas of the body (such as chest or abdominal pain)  Shortness of breath, dizziness, or fainting.   Worsening nausea (feeling sick to your stomach), vomiting, fever, or sweats  Any other emergent concerns regarding your health   Additional Information:  Your vital signs today were: BP 171/98 mmHg   Pulse 92   Temp(Src) 99.3 F (37.4 C) (Oral)   Resp 20   Ht 5\' 11"  (1.803 m)   Wt 107.6 kg   BMI 33.10 kg/m2   SpO2 99% If your blood pressure (BP) was elevated above 135/85 this visit, please have this repeated by your doctor within one month. --------------

## 2015-08-22 NOTE — ED Notes (Signed)
Pt. Injured his lower back while attempting to lift a couch this afternoon , pt. added left knee and right foot pain , ambulatory , pain increases with movement .

## 2015-08-22 NOTE — ED Provider Notes (Signed)
CSN: [id number]     Arrival date & time 08/22/15  1859 History  By signing my name below, I, Emmanuella Mensah, attest that this documentation has been prepared under the direction and in the presence of Audry Pili, PA-C. Electronically Signed: Angelene Giovanni, ED Scribe. 08/22/2015. 7:27 PM.    Chief Complaint  Patient presents with  . Back Pain   The history is provided by the patient. No language interpreter was used.   HPI Comments: Ralph Fowler is a 40 y.o. male with a hx of chronic back pain, bilateral knee arthroscopy, and trans foraminal lumbar interbody fusion (March 2016) who presents to the Emergency Department complaining of gradually worsening intermittent 8/10 (at its worse) left knee pain, numbness to the sole of his left foot, and exacerbation of his chronic lower back pain s/p fall that occurred this afternoon. He explains that he was attempting to move a couch when he heard a "pop" and fell. Pt reports associated pain with ambulation. He states that his knee pain is worse with sitting. He states that he suffered a back injury in March 2016 while at work which required a back fusion. No alleviating factors noted for the fall. Pt has not tried any medications PTA. He denies any fever, saddle anesthesia, bowel/bladder incontinence, or any open wounds.     Past Medical History  Diagnosis Date  . Stomach ulcer   . Pancreatitis   . Testicular torsion   . Epididymitis   . Seizures (HCC)     last grand mal was in 06/2012  . 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 Knee arthroscopy      right and left  . Rotator cuff repair      left  . Shoulder arthroscopy      left  . Tonsillectomy    . Palate / uvula biopsy / excision    . Wisdom tooth extraction    . Testicular exploration    . Transforaminal lumbar interbody fusion (tlif) with pedicle screw fixation 1 level  07/17/2014    L 4 L5  . Back surgery     No family history on  file. Social History  Substance Use Topics  . Smoking status: Former Games developer  . Smokeless tobacco: Current User    Types: Chew  . Alcohol Use: Yes    Review of Systems  A complete 10 system review of systems was obtained and all systems are negative except as noted in the HPI and PMH.    Allergies  Versed and Anti-inflammatory enzyme  Home Medications   Prior to Admission medications   Medication Sig Start Date End Date Taking? Authorizing Provider  docusate sodium (COLACE) 100 MG capsule Take 1 capsule (100 mg total) by mouth 3 (three) times daily as needed for mild constipation. Patient not taking: Reported on 03/26/2015 07/23/14   Venita Lick, MD  gabapentin (NEURONTIN) 300 MG capsule Take 300 mg by mouth 3 (three) times daily as needed. Nerve pain 12/05/14 12/05/15  Historical Provider, MD  ibuprofen (ADVIL,MOTRIN) 800 MG tablet Take 1 tablet (800 mg total) by mouth 3 (three) times daily. 06/29/15   Cheri Fowler, PA-C  methocarbamol (ROBAXIN) 500 MG tablet Take 1 tablet (500 mg total) by mouth 2 (two) times daily. Patient taking differently: Take 500 mg by mouth 2 (two) times daily as needed for muscle spasms.  02/28/15   Hope Orlene Och, NP  ondansetron [facility])  4 MG tablet Take 1 tablet (4 mg total) by mouth every 8 (eight) hours as needed for nausea or vomiting. Patient not taking: Reported on 03/26/2015 07/23/14   Venita Lick, MD  oxyCODONE-acetaminophen (PERCOCET/ROXICET) 5-325 MG tablet Take 1-2 tablets by mouth every 4 (four) hours as needed for severe pain. 06/29/15   Cheri Fowler, PA-C  predniSONE (DELTASONE) 20 MG tablet Take 2 tablets (40 mg total) by mouth daily. 06/29/15   Cheri Fowler, PA-C  traMADol (ULTRAM) 50 MG tablet Take 50 mg by mouth 3 (three) times daily. 03/05/15   Historical Provider, MD  traZODone (DESYREL) 100 MG tablet Take 200 mg by mouth at bedtime. 12/05/14   Historical Provider, MD   BP 171/98 mmHg  Pulse 92  Temp(Src) 99.3 F (37.4 C) (Oral)  Resp 20  Ht 5'  11" (1.803 m)  Wt 237 lb 3.4 oz (107.6 kg)  BMI 33.10 kg/m2  SpO2 99%   Physical Exam  Constitutional: He is oriented to person, place, and time. He appears well-developed and well-nourished.  HENT:  Head: Normocephalic and atraumatic.  Eyes: EOM are normal.  Neck: Normal range of motion.  Cardiovascular: Normal rate and regular rhythm.   Pulmonary/Chest: Effort normal.  Abdominal: Soft.  Musculoskeletal: Normal range of motion.       Lumbar back: He exhibits tenderness. He exhibits normal range of motion, no swelling, no edema, no deformity and no laceration.  Surgical scar noted on lumbar spine. Distal pulses intact. Motor/sensation intact. Pt able to ambulate.   Neurological: He is alert and oriented to person, place, and time.  Skin: Skin is warm and dry.  Psychiatric: He has a normal mood and affect. His behavior is normal. Thought content normal.  Nursing note and vitals reviewed.  ED Course  Procedures (including critical care time) DIAGNOSTIC STUDIES: Oxygen Saturation is 99% on RA, normal by my interpretation.    COORDINATION OF CARE: 7:24 PM- Pt advised of plan for treatment and pt agrees. Pt will receive x-ray for further evaluation. He will also receive Decadron, Toradol, and Percocet.   Labs Review Labs Reviewed - No data to display  Imaging Review No results found.   Audry Pili, PA-C has personally reviewed and evaluated these images and lab results as part of his medical decision-making.   EKG Interpretation None      MDM  I have reviewed and evaluated the relevant laboratory values. I have reviewed and evaluated the relevant imaging studies. I have reviewed the relevant previous healthcare records. I obtained HPI from historian.  ED Course:  Assessment: Patient with back pain after lifting couch today. Hx lumbar spinal fusion. Has been seen in the past for similar symptoms. No neurological deficits appreciated. Patient is ambulatory. No warning symptoms  of back pain including: fecal incontinence, urinary retention or overflow incontinence, night sweats, waking from sleep with back pain, unexplained fevers or weight loss, h/o cancer, IVDU, recent trauma. No concern for cauda equina, epidural abscess, or other serious cause of back pain. Given Toradol, Decadron, Percocet in ED. Conservative measures such as rest, ice/heat and pain medicine indicated with PCP follow-up if no improvement with conservative management. Given Rx Prednisone x 5 days.   Disposition/Plan:  DC Home Additional Verbal discharge instructions given and discussed with patient.  Pt Instructed to f/u with PCP in the next 48 hours for evaluation and treatment of symptoms. Return precautions given Pt acknowledges and agrees with plan  Supervising Physician Lyndal Pulley, MD   Final diagnoses:  Left-sided low back pain with left-sided sciatica    I personally performed the services described in this documentation, which was scribed in my presence. The recorded information has been reviewed and is accurate.   Audry Piliyler Malike Foglio, PA-C 08/22/15 1939  Lyndal Pulleyaniel Knott, MD 08/23/15 [phone]

## 2016-01-24 IMAGING — RF DG LUMBAR SPINE 2-3V
1 series · 2 of 2 positions shown · non-contrast
Comparison: Lumbar spine MRI 10/05/2013.

CLINICAL DATA: L4-L5 TLIF.

EXAM:
DG C-ARM 61-120 MIN; LUMBAR SPINE - 2-3 VIEW

[Series 1: run · 2 of 2 slices shown]
[im 1/2]
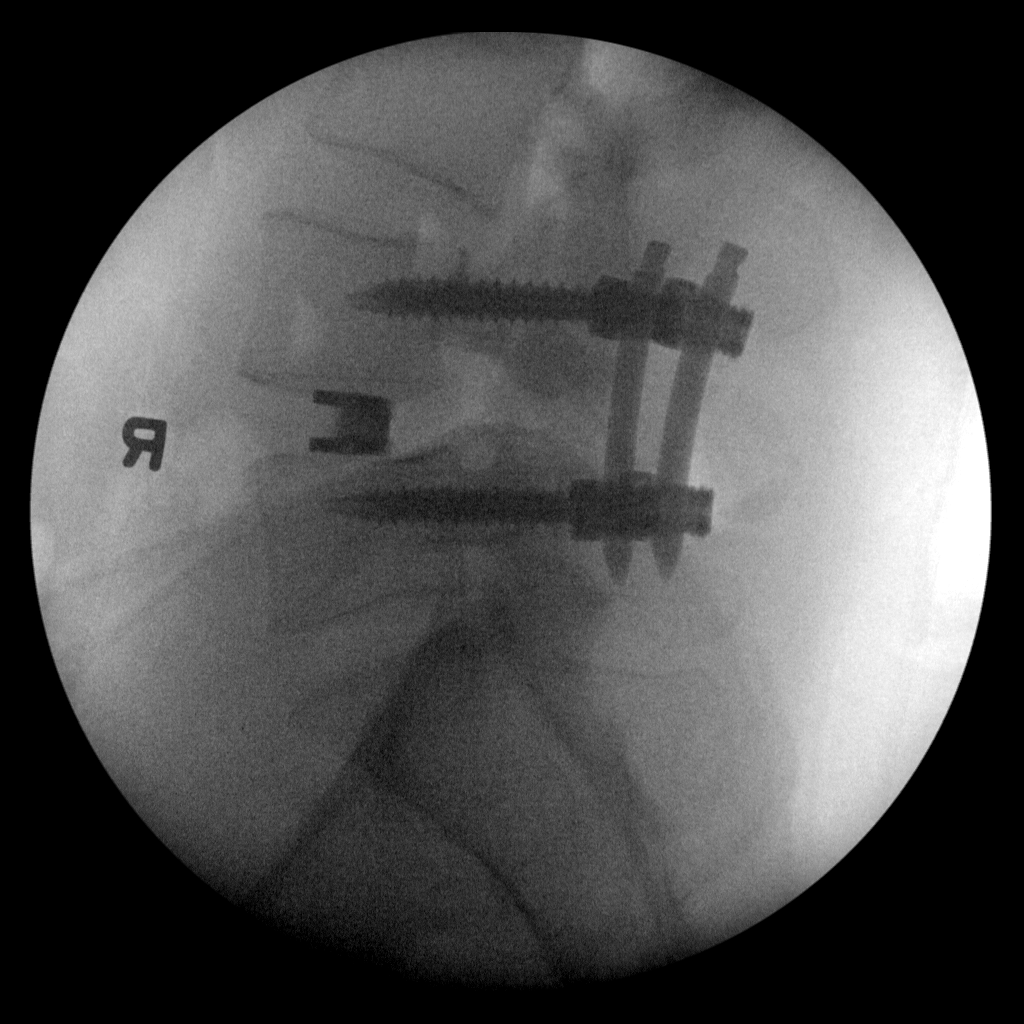
[im 2/2]
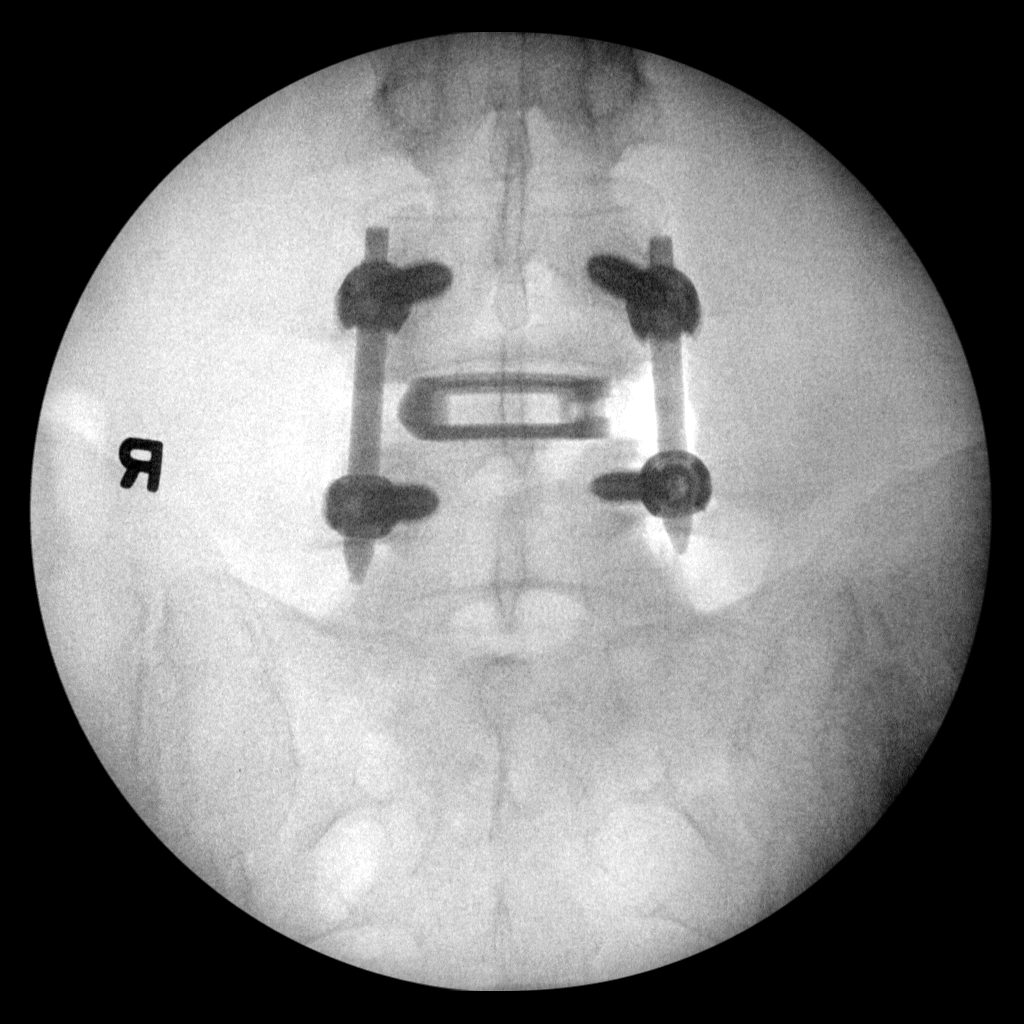

[2 of 2 positions shown; findings below may reference images not displayed]

FINDINGS: Posterior lumbar spinal fusion hardware demonstrated at the L4-5
level. Intervertebral disc spacer present L4-5. No acute
abnormality.
IMPRESSION: Postoperative changes compatible with L4-5 fusion.

## 2016-02-19 ENCOUNTER — Encounter (HOSPITAL_COMMUNITY): Payer: Self-pay

## 2016-02-19 ENCOUNTER — Emergency Department (HOSPITAL_COMMUNITY): Payer: Self-pay

## 2016-02-19 ENCOUNTER — Emergency Department (HOSPITAL_COMMUNITY)
Admission: EM | Admit: 2016-02-19 | Discharge: 2016-02-19 | Disposition: A | Discharge status: Home / Self Care | Payer: Self-pay | Attending: Emergency Medicine | Admitting: Emergency Medicine

## 2016-02-19 DIAGNOSIS — Z87891 Personal history of nicotine dependence: Secondary | ICD-10-CM | POA: Insufficient documentation

## 2016-02-19 DIAGNOSIS — Y929 Unspecified place or not applicable: Secondary | ICD-10-CM | POA: Insufficient documentation

## 2016-02-19 DIAGNOSIS — X500XXA Overexertion from strenuous movement or load, initial encounter: Secondary | ICD-10-CM | POA: Insufficient documentation

## 2016-02-19 DIAGNOSIS — Z79899 Other long term (current) drug therapy: Secondary | ICD-10-CM | POA: Insufficient documentation

## 2016-02-19 DIAGNOSIS — S46211A Strain of muscle, fascia and tendon of other parts of biceps, right arm, initial encounter: Secondary | ICD-10-CM | POA: Insufficient documentation

## 2016-02-19 DIAGNOSIS — Y999 Unspecified external cause status: Secondary | ICD-10-CM | POA: Insufficient documentation

## 2016-02-19 DIAGNOSIS — Y93B9 Activity, other involving muscle strengthening exercises: Secondary | ICD-10-CM | POA: Insufficient documentation

## 2016-02-19 MED ORDER — METHOCARBAMOL 500 MG PO TABS: 500.0000 mg | ORAL_TABLET | Freq: Two times a day (BID) | ORAL | 0 refills | Status: DC

## 2016-02-19 MED ORDER — OXYCODONE-ACETAMINOPHEN 5-325 MG PO TABS
1.0000 | ORAL_TABLET | Freq: Once | ORAL | Status: AC
  Administered 2016-02-19: 1 via ORAL
  Filled 2016-02-19: qty 1

## 2016-02-19 NOTE — Discharge Instructions (Signed)
Your injury is most consistent with biceps strain versus biceps tendon tear. Ice and rest her arm. Sling at all times. Ibuprofen or Tylenol for pain He can try Robaxin for muscle spasms. Follow-up with the orthopedics doctor soon as able next week. Return to the ED if any issues, any numbness or weakness in her hand, or you can follow-up with orthopedist as referred.

## 2016-02-19 NOTE — ED Notes (Signed)
Patient verbalized understanding of discharge instructions and denies any further needs or questions at this time. VS stable. Patient ambulatory with steady gait, declined wheelchair. Escorted to ED entrance.  

## 2016-02-19 NOTE — ED Provider Notes (Signed)
[facility]-EMERGENCY DEPT Provider Note   CSN: [id number] Arrival date & time: 02/19/16  2124  By signing my name below, I, Placido Sou, attest that this documentation has been prepared under the direction and in the presence of Jamill Wetmore, PA-C. Electronically Signed: Placido Sou, ED Scribe. 02/19/16. 10:18 PM.   History   Chief Complaint Chief Complaint  Patient presents with  . Arm Pain    HPI HPI Comments: Ralph Fowler is a 40 y.o. male who presents to the Emergency Department complaining of sudden onset RUE weakness which occurred earlier today. Pt states he was doing bicep curls with ~35 lbs and experienced sudden weakness in the arm and has been unable to use the arm without severe pain since the incident. He reports associated burning pain across his right bicep from his right shoulder to his right elbow. His pain worsens with RUE movement and palpation of the region. No other associated symptoms at this time.    The history is provided by the patient. No language interpreter was used.    Past Medical History:  Diagnosis Date  . 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 Epididymitis   . Pancreatitis   . Seizures (HCC)    last grand mal was in 06/2012  . Stomach ulcer   . Testicular torsion     Patient Active Problem List   Diagnosis Date Noted  . Altered mental status   . S/P spinal fusion   . Encephalopathy acute   . Post-operative state   . Post-op pain   . Back pain 07/17/2014  . 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 BACK SURGERY    . KNEE ARTHROSCOPY     right and left  . PALATE / UVULA BIOPSY / EXCISION    . ROTATOR CUFF REPAIR     left  . SHOULDER ARTHROSCOPY     left  . TESTICULAR EXPLORATION    . TONSILLECTOMY    . TRANSFORAMINAL LUMBAR INTERBODY FUSION (TLIF) WITH PEDICLE SCREW FIXATION 1 LEVEL  07/17/2014   L 4 L5  . WISDOM TOOTH EXTRACTION         Home Medications    Prior to Admission medications   Medication Sig Start Date End Date Taking? Authorizing Provider  docusate sodium (COLACE) 100 MG capsule Take 1 capsule (100 mg total) by mouth 3 (three) times daily as needed for mild constipation. Patient not taking: Reported on 03/26/2015 07/23/14   Venita Lick, MD  gabapentin (NEURONTIN) 300 MG capsule Take 300 mg by mouth 3 (three) times daily as needed. Nerve pain 12/05/14 12/05/15  Historical Provider, MD  ibuprofen (ADVIL,MOTRIN) 800 MG tablet Take 1 tablet (800 mg total) by mouth 3 (three) times daily. 06/29/15   Cheri Fowler, PA-C  methocarbamol (ROBAXIN) 500 MG tablet Take 1 tablet (500 mg total) by mouth 2 (two) times daily. Patient taking differently: Take 500 mg by mouth 2 (two) times daily as needed for muscle spasms.  02/28/15   Hope Orlene Och, NP  ondansetron (ZOFRAN) 4 MG tablet Take 1 tablet (4 mg total) by mouth every 8 (eight) hours as needed for nausea or vomiting. Patient not taking: Reported on 03/26/2015 07/23/14   Venita Lick, MD  oxyCODONE-acetaminophen (PERCOCET/ROXICET) 5-325 MG tablet Take 1-2 tablets by mouth every 4 (four) hours as needed for severe pain. 06/29/15   Cheri Fowler, PA-C  predniSONE (DELTASONE) 20 MG tablet  Take 2 tablets (40 mg total) by mouth daily with breakfast. 08/22/15   Audry Pili, PA-C  traMADol (ULTRAM) 50 MG tablet Take 50 mg by mouth 3 (three) times daily. 03/05/15   Historical Provider, MD  traZODone (DESYREL) 100 MG tablet Take 200 mg by mouth at bedtime. 12/05/14   Historical Provider, MD    Family History History reviewed. No pertinent family history.  Social History Social History  Substance Use Topics  . Smoking status: Former Games developer  . Smokeless tobacco: Current User    Types: Chew  . Alcohol use Yes     Allergies   Versed [midazolam] and Anti-inflammatory enzyme [nutritional supplements]   Review of Systems Review of Systems  Musculoskeletal: Positive for arthralgias  and myalgias.  Skin: Negative for color change and wound.  Neurological: Positive for weakness. Negative for numbness.    Physical Exam Updated Vital Signs BP (!) 139/107   Pulse 87   Temp 99.1 F (37.3 C) (Oral)   Resp 22   SpO2 100%   Physical Exam  Constitutional: He is oriented to person, place, and time. He appears well-developed and well-nourished.  HENT:  Head: Normocephalic and atraumatic.  Eyes: EOM are normal.  Neck: Normal range of motion.  Cardiovascular: Normal rate.   Pulmonary/Chest: Effort normal. No respiratory distress.  Musculoskeletal: Normal range of motion.  No obvious swelling, bruising, deformity noted to the right upper arm. Tender to palpation over proximal and distal biceps tendon insertion and over the belly of the biceps muscle. Patient unable to flex or extend his arm at elbow joint due to pain in the biceps. He is holding his arm and flexion at 90 at rest. Normal shoulder and normal wrist. Distal radial pulse intact.  Neurological: He is alert and oriented to person, place, and time.  Skin: Skin is warm and dry.  Psychiatric: He has a normal mood and affect.  Nursing note and vitals reviewed.   ED Treatments / Results  Labs (all labs ordered are listed, but only abnormal results are displayed) Labs Reviewed - No data to display  EKG  EKG Interpretation None       Radiology Dg Humerus Right  Result Date: 02/19/2016 CLINICAL DATA:  Sudden onset right upper extremity weakness occurring earlier today. Patient was doing biceps curls. EXAM: RIGHT HUMERUS - 2+ VIEW COMPARISON:  None. FINDINGS: There is no evidence of fracture or other focal bone lesions. Soft tissues are unremarkable. IMPRESSION: Negative. Electronically Signed   By: Tollie Eth M.D.   On: 02/19/2016 23:11    Procedures Procedures  DIAGNOSTIC STUDIES: Oxygen Saturation is 100% on RA, normal by my interpretation.    COORDINATION OF CARE: 10:16 PM Discussed next steps with  pt. Pt verbalized understanding and is agreeable with the plan.    Medications Ordered in ED Medications - No data to display   Initial Impression / Assessment and Plan / ED Course  I have reviewed the triage vital signs and the nursing notes.  Pertinent labs & imaging results that were available during my care of the patient were reviewed by me and considered in my medical decision making (see chart for details).  Clinical Course    Patient with injury to the right bicep. He reports hearing a pop. Question strain versus a tear. X-ray of the humerus is negative. I looked patient up and controlled substances database, patient receives 90 oxycodone 10 mg every month. Last filled one month ago. I asked patient if he still has  pain medications at home, patient stated that he ran out today. He is supposed to get a refill in 2 days. I do not feel comfortable giving him more prescriptions. There were several months when I reviewed his database where he received opiate prescriptions from different providers. I will give him prescription for Robaxin. I instructed him to ice, elevate his arm, sling provided. Follow-up as soon as able with orthopedics.  Vitals:   02/19/16 2144  BP: (!) 139/107  Pulse: 87  Resp: 22  Temp: 99.1 F (37.3 C)  TempSrc: Oral  SpO2: 100%    I personally performed the services described in this documentation, which was scribed in my presence. The recorded information has been reviewed and is accurate.  Final Clinical Impressions(s) / ED Diagnoses   Final diagnoses:  Biceps strain, right, initial encounter    New Prescriptions New Prescriptions   METHOCARBAMOL (ROBAXIN) 500 MG TABLET    Take 1 tablet (500 mg total) by mouth 2 (two) times daily.     Jaynie Crumbleatyana Kahil Agner, PA-C 02/19/16 2325    Benjiman CoreNathan Pickering, MD 02/20/16 [phone]

## 2016-02-19 NOTE — ED Triage Notes (Signed)
Pt complaining of R arm pain. Pt states was working out today, heard pop in R arm, unable to lift R arm. No obvious deformity at triage.

## 2016-08-29 ENCOUNTER — Emergency Department (HOSPITAL_COMMUNITY)
Admission: EM | Admit: 2016-08-29 | Discharge: 2016-08-29 | Disposition: A | Discharge status: Home / Self Care | Payer: Worker's Compensation | Attending: Emergency Medicine | Admitting: Emergency Medicine

## 2016-08-29 ENCOUNTER — Encounter (HOSPITAL_COMMUNITY): Payer: Self-pay | Admitting: Emergency Medicine

## 2016-08-29 DIAGNOSIS — Y929 Unspecified place or not applicable: Secondary | ICD-10-CM | POA: Insufficient documentation

## 2016-08-29 DIAGNOSIS — X58XXXA Exposure to other specified factors, initial encounter: Secondary | ICD-10-CM | POA: Diagnosis not present

## 2016-08-29 DIAGNOSIS — S0592XA Unspecified injury of left eye and orbit, initial encounter: Secondary | ICD-10-CM | POA: Diagnosis not present

## 2016-08-29 DIAGNOSIS — Y939 Activity, unspecified: Secondary | ICD-10-CM | POA: Insufficient documentation

## 2016-08-29 DIAGNOSIS — Y999 Unspecified external cause status: Secondary | ICD-10-CM | POA: Insufficient documentation

## 2016-08-29 DIAGNOSIS — S0591XA Unspecified injury of right eye and orbit, initial encounter: Secondary | ICD-10-CM | POA: Insufficient documentation

## 2016-08-29 DIAGNOSIS — Z87891 Personal history of nicotine dependence: Secondary | ICD-10-CM | POA: Insufficient documentation

## 2016-08-29 MED ORDER — ERYTHROMYCIN 2 % EX OINT: TOPICAL_OINTMENT | CUTANEOUS | 0 refills | Status: AC

## 2016-08-29 MED ORDER — HYDROCODONE-ACETAMINOPHEN 5-325 MG PO TABS
1.0000 | ORAL_TABLET | Freq: Once | ORAL | Status: AC
  Administered 2016-08-29: 1 via ORAL
  Filled 2016-08-29: qty 1

## 2016-08-29 MED ORDER — TETRACAINE HCL 0.5 % OP SOLN
2.0000 [drp] | Freq: Once | OPHTHALMIC | Status: AC
  Administered 2016-08-29: 2 [drp] via OPHTHALMIC
  Filled 2016-08-29: qty 4

## 2016-08-29 MED ORDER — DILTIAZEM HCL-DEXTROSE 100-5 MG/100ML-% IV SOLN (PREMIX): 5.0000 mg/h | Freq: Once | INTRAVENOUS | Status: DC

## 2016-08-29 MED ORDER — FLUORESCEIN SODIUM 0.6 MG OP STRP
1.0000 | ORAL_STRIP | Freq: Once | OPHTHALMIC | Status: AC
Start: 2016-08-29 — End: 2016-08-29
  Administered 2016-08-29: 1 via OPHTHALMIC
  Filled 2016-08-29: qty 1

## 2016-08-29 MED ORDER — HYDROCODONE-ACETAMINOPHEN 5-325 MG PO TABS: 2.0000 | ORAL_TABLET | ORAL | 0 refills | Status: AC | PRN

## 2016-08-29 NOTE — Discharge Instructions (Signed)
You were seen in the ED today with an eye injury. Continue to flush the area tonight and use the antibiotic ointment as prescribed. You can take Hydrocodone as needed for pain.   You need to see the eye doctor tomorrow. The number is listed. Call tomorrow AM and they should be expecting you.   Return to the ED with any new or worsening vision problems.

## 2016-08-29 NOTE — ED Notes (Signed)
Patient right eye irrigated using morgan lens and normal saline per MD order.

## 2016-08-29 NOTE — ED Provider Notes (Signed)
Emergency Department Provider Note   I have reviewed the triage vital signs and the nursing notes.   HISTORY  Chief Complaint Eye Injury   HPI Ralph Fowler is a 41 y.o. male with no significant PMH presents to the ED with face and eye pain after having concrete splash in his face. Injury occurred approximately 8 hours PTA. Vision is slightly more blurry. No intraoral or intranasal pain. Patient has been flushing the eyes throughout the day with tap water and eye drops with minimal relief. He has been unable to remove contact lenses 2/2 pain. No eye drainage. Has also experienced some lid swelling. No radiation or symptoms. Pain is constant and moderate in severity.   Past Medical History:  Diagnosis Date  . 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 Epididymitis   . Pancreatitis   . Seizures (HCC)    last grand mal was in 06/2012  . Stomach ulcer   . Testicular torsion     Patient Active Problem List   Diagnosis Date Noted  . Altered mental status   . S/P spinal fusion   . Encephalopathy acute   . Post-operative state   . Post-op pain   . Back pain 07/17/2014  . 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 BACK SURGERY    . KNEE ARTHROSCOPY     right and left  . PALATE / UVULA BIOPSY / EXCISION    . ROTATOR CUFF REPAIR     left  . SHOULDER ARTHROSCOPY     left  . TESTICULAR EXPLORATION    . TONSILLECTOMY    . TRANSFORAMINAL LUMBAR INTERBODY FUSION (TLIF) WITH PEDICLE SCREW FIXATION 1 LEVEL  07/17/2014   L 4 L5  . WISDOM TOOTH EXTRACTION      Current Outpatient Rx  . Order #: [id number] Class: Print  . Order #: [id number] Class: Print  . Order #: [id number] Class: Print  . Order #: [id number] Class: Print  . Order #: [id number] Class: Print  . Order #: [id number] Class: Print  . Order #: [id number] Class: Print  . Order #: [id number] Class: Print  . Order #: [id number] Class: Print     Allergies Versed [midazolam] and Anti-inflammatory enzyme [nutritional supplements]  Family History  Problem Relation Age of Onset  . Hypertension Other     Social History Social History  Substance Use Topics  . Smoking status: Former Games developer  . Smokeless tobacco: Current User    Types: Chew  . Alcohol use No    Review of Systems  Constitutional: No fever/chills Eyes: Positive eye pain and blurry vision.  ENT: No sore throat. Cardiovascular: Denies chest pain. Respiratory: Denies shortness of breath. Gastrointestinal: No abdominal pain.  No nausea, no vomiting.  No diarrhea.  No constipation. Genitourinary: Negative for dysuria. Musculoskeletal: Negative for back pain. Skin: Negative for rash. Neurological: Negative for headaches, focal weakness or numbness.  10-point ROS otherwise negative.  ____________________________________________   PHYSICAL EXAM:  VITAL SIGNS: ED Triage Vitals  Enc Vitals Group     BP 08/29/16 1921 (!) 155/80     Pulse Rate 08/29/16 1921 82     Resp 08/29/16 1921 20     Temp 08/29/16 1921 99.9 F (37.7 C)     Temp Source 08/29/16 1921 Oral     SpO2 08/29/16 1921 96 %     Weight 08/29/16 1921 242 lb 2 oz (109.8 kg)     Height 08/29/16  1921  (1.803 m)     Pain Score 08/29/16 1934 8   Constitutional: Alert and oriented. Well appearing and in no acute distress. Eyes: Conjunctivae are slightly injected with slightly swollen eyelids bilaterally. PERRL. EOMI. No obvious abrasion on staining of the eye. Visual acuity R: 20/70 L: 20/40 (with contacts in place) pH right: 8.0 and left: 7.0 Head: Atraumatic. Nose: No congestion/rhinnorhea. Mouth/Throat: Mucous membranes are moist.  Oropharynx non-erythematous. Neck: No stridor.  Cardiovascular: Normal rate, regular rhythm. Good peripheral circulation. Grossly normal heart sounds.   Respiratory: Normal respiratory effort.  No retractions. Lungs CTAB. Gastrointestinal: Soft and  nontender. No distention.  Musculoskeletal: No lower extremity tenderness nor edema. No gross deformities of extremities. Neurologic:  Normal speech and language. No gross focal neurologic deficits are appreciated.  Skin:  Skin is warm, dry and intact. Faint redness over the cheeks, nose, and forehead.   ____________________________________________   PROCEDURES  Procedure(s) performed:   Procedures  None ____________________________________________   INITIAL IMPRESSION / ASSESSMENT AND PLAN / ED COURSE  Pertinent labs & imaging results that were available during my care of the patient were reviewed by me and considered in my medical decision making (see chart for details).  Patient presents to the ED with bilateral eye burning sensation after splashing concrete in his face. Visual acuity and pH noted above. Discussed with Dr. Charlotte Sanes who advises continued flushing of the the right eye and f/u in office tomorrow. Attempted to place morgan lens but patient could not tolerate. Dripping saline over eye x 1 L with improved pH in the right eye. Symptoms remain moderate in severity. No evidence of severe face burn/irritation. Contact lenses removed. Will not put back in at home. Plan for Norco for pain and erytheromycin ointment and ophthalmology f/u tomorrow with Dr. Charlotte Sanes.   At this time, I do not feel there is any life-threatening condition present. I have reviewed and discussed all results (EKG, imaging, lab, urine as appropriate), exam findings with patient. I have reviewed nursing notes and appropriate previous records.  I feel the patient is safe to be discharged home without further emergent workup. Discussed usual and customary return precautions. Patient and family (if present) verbalize understanding and are comfortable with this plan.  Patient will follow-up with their primary care provider. If they do not have a primary care provider, information for follow-up has been provided to  them. All questions have been answered.   ____________________________________________  FINAL CLINICAL IMPRESSION(S) / ED DIAGNOSES  Final diagnoses:  Bilateral eye injuries, initial encounter     MEDICATIONS GIVEN DURING THIS VISIT:  Medications  tetracaine (PONTOCAINE) 0.5 % ophthalmic solution 2 drop (2 drops Both Eyes Given 08/29/16 2038)  fluorescein ophthalmic strip 1 strip (1 strip Both Eyes Given 08/29/16 2038)  HYDROcodone-acetaminophen (NORCO/VICODIN) 5-325 MG per tablet 1 tablet (1 tablet Oral Given 08/29/16 2141)     NEW OUTPATIENT MEDICATIONS STARTED DURING THIS VISIT:  Discharge Medication List as of 08/29/2016 10:34 PM    START taking these medications   Details  Erythromycin 2 % ointment Apply a ribbon of ointment to both eyes three times daily, Print    HYDROcodone-acetaminophen (NORCO/VICODIN) 5-325 MG tablet Take 2 tablets by mouth every 4 (four) hours as needed., Starting Mon 08/29/2016, Print         Note:  This document was prepared using Dragon voice recognition software and may include unintentional dictation errors.  Alona Bene, MD Emergency Medicine   Maia Plan, MD 08/30/16 [phone]

## 2016-08-29 NOTE — ED Triage Notes (Signed)
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

## 2016-09-08 ENCOUNTER — Emergency Department (HOSPITAL_COMMUNITY): Payer: Self-pay

## 2016-09-08 ENCOUNTER — Emergency Department (HOSPITAL_COMMUNITY)
Admission: EM | Admit: 2016-09-08 | Discharge: 2016-09-09 | Disposition: A | Discharge status: Home / Self Care | Payer: Self-pay | Attending: Emergency Medicine | Admitting: Emergency Medicine

## 2016-09-08 ENCOUNTER — Encounter (HOSPITAL_COMMUNITY): Payer: Self-pay | Admitting: Emergency Medicine

## 2016-09-08 DIAGNOSIS — W1830XA Fall on same level, unspecified, initial encounter: Secondary | ICD-10-CM | POA: Insufficient documentation

## 2016-09-08 DIAGNOSIS — M545 Low back pain: Secondary | ICD-10-CM | POA: Insufficient documentation

## 2016-09-08 DIAGNOSIS — Y929 Unspecified place or not applicable: Secondary | ICD-10-CM | POA: Insufficient documentation

## 2016-09-08 DIAGNOSIS — M549 Dorsalgia, unspecified: Secondary | ICD-10-CM

## 2016-09-08 DIAGNOSIS — Y999 Unspecified external cause status: Secondary | ICD-10-CM | POA: Insufficient documentation

## 2016-09-08 DIAGNOSIS — R55 Syncope and collapse: Secondary | ICD-10-CM | POA: Insufficient documentation

## 2016-09-08 DIAGNOSIS — W19XXXA Unspecified fall, initial encounter: Secondary | ICD-10-CM

## 2016-09-08 DIAGNOSIS — F17210 Nicotine dependence, cigarettes, uncomplicated: Secondary | ICD-10-CM | POA: Insufficient documentation

## 2016-09-08 DIAGNOSIS — Z79899 Other long term (current) drug therapy: Secondary | ICD-10-CM | POA: Insufficient documentation

## 2016-09-08 DIAGNOSIS — Y939 Activity, unspecified: Secondary | ICD-10-CM | POA: Insufficient documentation

## 2016-09-08 LAB — BASIC METABOLIC PANEL
Anion gap: 8 (ref 5–15)
BUN: 10 mg/dL (ref 6–20)
CO2: 24 mmol/L (ref 22–32)
Calcium: 8.5 mg/dL — ABNORMAL LOW (ref 8.9–10.3)
Chloride: 106 mmol/L (ref 101–111)
Creatinine, Ser: 1.04 mg/dL (ref 0.61–1.24)
GFR calc Af Amer: >60 mL/min (ref >60)
GFR calc non Af Amer: >60 mL/min (ref >60)
Glucose, Bld: 82 mg/dL (ref 65–99)
Potassium: 3.5 mmol/L (ref 3.5–5.1)
Sodium: 138 mmol/L (ref 135–145)

## 2016-09-08 LAB — CBC WITH DIFFERENTIAL/PLATELET
Basophils Absolute: 0.1 10*3/uL (ref 0.0–0.1)
Basophils Relative: 1 %
Eosinophils Absolute: 0.5 10*3/uL (ref 0.0–0.7)
Eosinophils Relative: 5 %
HCT: 41.4 % (ref 39.0–52.0)
Hemoglobin: 13.6 g/dL (ref 13.0–17.0)
Lymphocytes Relative: 35 %
Lymphs Abs: 3.3 10*3/uL (ref 0.7–4.0)
MCH: 29.8 pg (ref 26.0–34.0)
MCHC: 32.9 g/dL (ref 30.0–36.0)
MCV: 90.8 fL (ref 78.0–100.0)
Monocytes Absolute: 0.7 10*3/uL (ref 0.1–1.0)
Monocytes Relative: 7 %
Neutro Abs: 4.9 10*3/uL (ref 1.7–7.7)
Neutrophils Relative %: 52 %
Platelets: 206 10*3/uL (ref 150–400)
RBC: 4.56 MIL/uL (ref 4.22–5.81)
RDW: 14.6 % (ref 11.5–15.5)
WBC: 9.4 10*3/uL (ref 4.0–10.5)

## 2016-09-08 LAB — URINALYSIS, ROUTINE W REFLEX MICROSCOPIC
Bilirubin Urine: NEGATIVE
Glucose, UA: NEGATIVE mg/dL
Hgb urine dipstick: NEGATIVE
Ketones, ur: NEGATIVE mg/dL
Leukocytes, UA: NEGATIVE
Nitrite: NEGATIVE
Protein, ur: NEGATIVE mg/dL
Specific Gravity, Urine: 1.016 (ref 1.005–1.030)
pH: 5 (ref 5.0–8.0)

## 2016-09-08 MED ORDER — MORPHINE SULFATE (PF) 4 MG/ML IV SOLN
4.0000 mg | Freq: Once | INTRAVENOUS | Status: AC
  Administered 2016-09-09: 4 mg via INTRAVENOUS
  Filled 2016-09-08: qty 1

## 2016-09-08 MED ORDER — SODIUM CHLORIDE 0.9 % IV BOLUS (SEPSIS)
1000.0000 mL | Freq: Once | INTRAVENOUS | Status: AC
  Administered 2016-09-08: 1000 mL via INTRAVENOUS

## 2016-09-08 MED ORDER — METHOCARBAMOL 1000 MG/10ML IJ SOLN: 1000.0000 mg | Freq: Once | INTRAMUSCULAR | Status: DC

## 2016-09-08 MED ORDER — MORPHINE SULFATE (PF) 4 MG/ML IV SOLN
4.0000 mg | Freq: Once | INTRAVENOUS | Status: AC
  Administered 2016-09-08: 4 mg via INTRAVENOUS
  Filled 2016-09-08: qty 1

## 2016-09-08 MED ORDER — METHOCARBAMOL 1000 MG/10ML IJ SOLN
1000.0000 mg | Freq: Once | INTRAMUSCULAR | Status: DC
  Filled 2016-09-08: qty 10

## 2016-09-08 MED ORDER — DEXTROSE 5 % IV SOLN
1000.0000 mg | Freq: Once | INTRAVENOUS | Status: AC
  Administered 2016-09-08: 1000 mg via INTRAVENOUS
  Filled 2016-09-08: qty 10

## 2016-09-08 MED ORDER — GADOBENATE DIMEGLUMINE 529 MG/ML IV SOLN
20.0000 mL | Freq: Once | INTRAVENOUS | Status: AC | PRN
  Administered 2016-09-08: 20 mL via INTRAVENOUS

## 2016-09-08 NOTE — ED Triage Notes (Signed)
Pt states he works outside and it was hot today and he felt thirsty all day so he was trying to drink a lot of water  Pt states he became very dizzy and lost his balance and fell off some scaffolding  Pt states he fell about 5 ft and landed on his back  Pt is c/o lower back pain that radiates down his left leg  Pt states his left knee is painful as well   Pt denies LOC  Pt denies hitting his head

## 2016-09-08 NOTE — ED Provider Notes (Signed)
[facility]-EMERGENCY DEPT Provider Note   CSN: [id number] Arrival date & time: 09/08/16  1903     History   Chief Complaint Chief Complaint  Patient presents with  . Near Syncope  . Fall    HPI Ralph Fowler is a 41 y.o. male with history of spinal fusion who presents following a fall and near syncopal episode with back pain. Patient states he has been working outside all week. He was feeling thirsty all day and dehydrated. He was trying to drink a lot of water. Patient began feeling lightheaded acute is going to pass out and fell backwards off of a 5 foot scaffolding. He fell on his back. He did not hit his head or lose consciousness. He reports severe back pain radiating down his left leg. He states this left great toe is numb. Patient also reports left knee pain, however states this is mild in comparison his back. Patient does have a history of problems with his left knee and has had a scope in the past. He states he does not believe he has urinated all day despite drinking so much water. He denies any saddle anesthesia or bowel or bladder incontinence. Patient denies any fevers, chest pain, shortness of breath, abdominal pain, nausea, vomiting. Patient did not take any medications prior to arrival.  HPI  Past Medical History:  Diagnosis Date  . 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 Epididymitis   . Pancreatitis   . Seizures (HCC)    last grand mal was in 06/2012  . Stomach ulcer   . Testicular torsion     Patient Active Problem List   Diagnosis Date Noted  . Altered mental status   . S/P spinal fusion   . Encephalopathy acute   . Post-operative state   . Post-op pain   . Back pain 07/17/2014  . 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 BACK SURGERY    . KNEE ARTHROSCOPY     right and left  . PALATE / UVULA BIOPSY / EXCISION    . ROTATOR CUFF REPAIR     left  . SHOULDER ARTHROSCOPY       left  . TESTICULAR EXPLORATION    . TONSILLECTOMY    . TRANSFORAMINAL LUMBAR INTERBODY FUSION (TLIF) WITH PEDICLE SCREW FIXATION 1 LEVEL  07/17/2014   L 4 L5  . WISDOM TOOTH EXTRACTION         Home Medications    Prior to Admission medications   Medication Sig Start Date End Date Taking? Authorizing Provider  Erythromycin 2 % ointment Apply a ribbon of ointment to both eyes three times daily 08/29/16  Yes Long, Arlyss Repress, MD  HYDROcodone-acetaminophen (NORCO/VICODIN) 5-325 MG tablet Take 2 tablets by mouth every 4 (four) hours as needed. 08/29/16  Yes Long, Arlyss Repress, MD  docusate sodium (COLACE) 100 MG capsule Take 1 capsule (100 mg total) by mouth 3 (three) times daily as needed for mild constipation. Patient not taking: Reported on 03/26/2015 07/23/14   Venita Lick, MD  ibuprofen (ADVIL,MOTRIN) 800 MG tablet Take 1 tablet (800 mg total) by mouth 3 (three) times daily. Patient not taking: Reported on 08/29/2016 06/29/15   Cheri Fowler, PA-C  methocarbamol (ROBAXIN) 500 MG tablet Take 1 tablet (500 mg total) by mouth 2 (two) times daily. Patient not taking: Reported on 08/29/2016 02/28/15   Janne Napoleon, NP  methocarbamol (ROBAXIN) 500  MG tablet Take 1 tablet (500 mg total) by mouth 2 (two) times daily. Patient not taking: Reported on 08/29/2016 02/19/16   Jaynie Crumble, PA-C  ondansetron (ZOFRAN) 4 MG tablet Take 1 tablet (4 mg total) by mouth every 8 (eight) hours as needed for nausea or vomiting. Patient not taking: Reported on 03/26/2015 07/23/14   Venita Lick, MD  oxyCODONE-acetaminophen (PERCOCET/ROXICET) 5-325 MG tablet Take 1-2 tablets by mouth every 4 (four) hours as needed for severe pain. Patient not taking: Reported on 08/29/2016 06/29/15   Cheri Fowler, PA-C  predniSONE (DELTASONE) 20 MG tablet Take 2 tablets (40 mg total) by mouth daily with breakfast. Patient not taking: Reported on 08/29/2016 08/22/15   Audry Pili, PA-C    Family History Family History  Problem  Relation Age of Onset  . Hypertension Other   . Diabetes Father     Social History Social History  Substance Use Topics  . Smoking status: Current Some Day Smoker    Types: Cigarettes  . Smokeless tobacco: Current User    Types: Chew  . Alcohol use No     Allergies   Versed [midazolam] and Anti-inflammatory enzyme [nutritional supplements]   Review of Systems Review of Systems  Constitutional: Negative for chills and fever.  HENT: Negative for facial swelling and sore throat.   Respiratory: Negative for shortness of breath.   Cardiovascular: Negative for chest pain.  Gastrointestinal: Negative for abdominal pain, nausea and vomiting.  Genitourinary: Positive for decreased urine volume. Negative for dysuria.  Musculoskeletal: Positive for arthralgias and back pain. Negative for neck pain.  Skin: Negative for rash and wound.  Neurological: Negative for headaches.  Psychiatric/Behavioral: The patient is not nervous/anxious.      Physical Exam Updated Vital Signs BP (!) 152/101 (BP Location: Left Arm)   Pulse 79   Temp 98.2 F (36.8 C) (Oral)   Resp 13   SpO2 99%   Physical Exam  Constitutional: He appears well-developed and well-nourished. No distress.  HENT:  Head: Normocephalic and atraumatic.  Mouth/Throat: Oropharynx is clear and moist. No oropharyngeal exudate.  Eyes: Conjunctivae are normal. Pupils are equal, round, and reactive to light. Right eye exhibits no discharge. Left eye exhibits no discharge. No scleral icterus.  Neck: Normal range of motion. Neck supple. No thyromegaly present.  Cardiovascular: Normal rate, regular rhythm, normal heart sounds and intact distal pulses.  Exam reveals no gallop and no friction rub.   No murmur heard. Pulmonary/Chest: Effort normal and breath sounds normal. No stridor. No respiratory distress. He has no wheezes. He has no rales.  Abdominal: Soft. Bowel sounds are normal. He exhibits no distension. There is no  tenderness. There is no rebound and no guarding.  Musculoskeletal: He exhibits no edema.       Left knee: No tenderness found.       Thoracic back: He exhibits tenderness and bony tenderness.       Lumbar back: He exhibits tenderness and bony tenderness.       Back:  Lymphadenopathy:    He has no cervical adenopathy.  Neurological: He is alert. A sensory deficit is present. Coordination normal.  Reflex Scores:      Patellar reflexes are 2+ on the right side and 2+ on the left side.      Achilles reflexes are 2+ on the right side and 2+ on the left side. Patient with only a pressure sensation into left great toe, medial aspect of foot, and medial calf  Skin: Skin is  warm and dry. No rash noted. He is not diaphoretic. No pallor.  Psychiatric: He has a normal mood and affect.  Nursing note and vitals reviewed.    ED Treatments / Results  Labs (all labs ordered are listed, but only abnormal results are displayed) Labs Reviewed  BASIC METABOLIC PANEL  CBC WITH DIFFERENTIAL/PLATELET  URINALYSIS, ROUTINE W REFLEX MICROSCOPIC    EKG  EKG Interpretation None       Radiology No results found.  Procedures Procedures (including critical care time)  Medications Ordered in ED Medications  methocarbamol (ROBAXIN) 1,000 mg in dextrose 5 % 50 mL IVPB (not administered)  sodium chloride 0.9 % bolus 1,000 mL (1,000 mLs Intravenous New Bag/Given 09/08/16 2117)  morphine 4 MG/ML injection 4 mg (4 mg Intravenous Given 09/08/16 2117)     Initial Impression / Assessment and Plan / ED Course  I have reviewed the triage vital signs and the nursing notes.  Pertinent labs & imaging results that were available during my care of the patient were reviewed by me and considered in my medical decision making (see chart for details).     At shift change, patient care transferred to Elpidio AnisShari Upstill, PA-C for continued evaluation, follow up of Imaging, labs and determination of disposition.  Anticipate discharge if patient can ambulate prior to discharge. Plan to send home with follow-up to back surgeon/neurosurgery for further evaluation of new, small disc protrusion found on MRI. No strength deficits or reflex deficits.   Final Clinical Impressions(s) / ED Diagnoses   Final diagnoses:  Back pain  Fall    New Prescriptions New Prescriptions   No medications on file     Verdis PrimeLaw, Jaylah Goodlow M, PA-C 09/09/16 0715    Mesner, Barbara CowerJason, MD 09/10/16 [phone]

## 2016-09-08 NOTE — ED Provider Notes (Signed)
Fall today with low left back pain secondary to near syncope -?dehydration H/o lumbar fusion Has radicular pain to left with great toe numbness MRI - small disc protrusion L5-S1 - warrants outpatient follow up with his neurosurgeon Getting IVF's, ambulate,  Has not urinated today - recheck ability to urinate  Patient received fluids and feels much better. Still has back pain. He will be discharged home with f/u with neurosurgeon per plan of previous treatment team. He has urinated here without difficulty. He is ambulatory without lightheadedness.    Elpidio AnisUpstill, Nataya Bastedo, PA-C 09/09/16 0104    Marily MemosMesner, Jason, MD 09/10/16 [phone]

## 2016-09-08 NOTE — ED Notes (Signed)
Pt taken to MRI  

## 2016-09-09 MED ORDER — DIPHENHYDRAMINE HCL 25 MG PO CAPS
25.0000 mg | ORAL_CAPSULE | Freq: Once | ORAL | Status: AC
  Administered 2016-09-09: 25 mg via ORAL
  Filled 2016-09-09: qty 1

## 2016-09-09 MED ORDER — OXYCODONE-ACETAMINOPHEN 5-325 MG PO TABS: 1.0000 | ORAL_TABLET | ORAL | 0 refills | Status: DC | PRN

## 2016-09-09 MED ORDER — METHOCARBAMOL 500 MG PO TABS: 500.0000 mg | ORAL_TABLET | Freq: Two times a day (BID) | ORAL | 0 refills | Status: AC

## 2016-09-09 NOTE — Discharge Instructions (Signed)
See your neurosurgeon for further management of back pain after fall.

## 2017-03-13 ENCOUNTER — Emergency Department (HOSPITAL_COMMUNITY)
Admission: EM | Admit: 2017-03-13 | Discharge: 2017-03-13 | Disposition: A | Discharge status: Home / Self Care | Payer: Self-pay | Attending: Emergency Medicine | Admitting: Emergency Medicine

## 2017-03-13 ENCOUNTER — Emergency Department (HOSPITAL_COMMUNITY): Payer: Self-pay

## 2017-03-13 ENCOUNTER — Encounter (HOSPITAL_COMMUNITY): Payer: Self-pay

## 2017-03-13 DIAGNOSIS — F17210 Nicotine dependence, cigarettes, uncomplicated: Secondary | ICD-10-CM | POA: Insufficient documentation

## 2017-03-13 DIAGNOSIS — Y990 Civilian activity done for income or pay: Secondary | ICD-10-CM | POA: Insufficient documentation

## 2017-03-13 DIAGNOSIS — W270XXA Contact with workbench tool, initial encounter: Secondary | ICD-10-CM | POA: Insufficient documentation

## 2017-03-13 DIAGNOSIS — S62600B Fracture of unspecified phalanx of right index finger, initial encounter for open fracture: Secondary | ICD-10-CM

## 2017-03-13 DIAGNOSIS — Y9389 Activity, other specified: Secondary | ICD-10-CM | POA: Insufficient documentation

## 2017-03-13 DIAGNOSIS — Y9289 Other specified places as the place of occurrence of the external cause: Secondary | ICD-10-CM | POA: Insufficient documentation

## 2017-03-13 DIAGNOSIS — S62660A Nondisplaced fracture of distal phalanx of right index finger, initial encounter for closed fracture: Secondary | ICD-10-CM | POA: Insufficient documentation

## 2017-03-13 MED ORDER — OXYCODONE-ACETAMINOPHEN 5-325 MG PO TABS: 2.0000 | ORAL_TABLET | ORAL | 0 refills | Status: AC | PRN

## 2017-03-13 MED ORDER — AMOXICILLIN-POT CLAVULANATE 875-125 MG PO TABS: 1.0000 | ORAL_TABLET | Freq: Two times a day (BID) | ORAL | 0 refills | Status: AC

## 2017-03-13 MED ORDER — LIDOCAINE HCL 2 % IJ SOLN
20.0000 mL | Freq: Once | INTRAMUSCULAR | Status: AC
  Administered 2017-03-13: 400 mg via INTRADERMAL
  Filled 2017-03-13: qty 20

## 2017-03-13 MED ORDER — OXYCODONE-ACETAMINOPHEN 5-325 MG PO TABS
1.0000 | ORAL_TABLET | ORAL | Status: DC | PRN
  Administered 2017-03-13: 1 via ORAL
  Filled 2017-03-13: qty 1

## 2017-03-13 NOTE — Discharge Instructions (Addendum)
Keep wound clean and dry Have stitches removed in 10 days Take Augmentin twice daily for the next week (take with food) Take Percocet as needed for pain. Take Ibuprofen 400mg  three times daily Keep splint on until you see Dr. Mina MarbleWeingold. Call his office for an appointment on Thursday.

## 2017-03-13 NOTE — ED Triage Notes (Signed)
Per Pt, Pt was using a sledge hammer when he missed and the rebar kicked back and cut his finger. Noted that he could see the bone and bleeding was significant. Bleeding controlled at this time.

## 2017-03-13 NOTE — Progress Notes (Signed)
Orthopedic Tech Progress Note Patient Details:  Venetia ConstableGary Hellberg June 02, 1975 [id number]  Ortho Devices Type of Ortho Device: Arm sling, Finger splint Ortho Device/Splint Location: applied finger splint to pt right index finger.  pt tolerated application well.  provided and applied arm sling to help keep finger elevated.   Ortho Device/Splint Interventions: Application, Adjustment   Alvina ChouWilliams, Jonavon Trieu C 03/13/2017, 10:00 PM

## 2017-03-13 NOTE — ED Provider Notes (Signed)
[facility] Provider Note   CSN: [id number] Arrival date & time: 03/13/17  1749     History   Chief Complaint Chief Complaint  Patient presents with  . Finger Injury    HPI Ralph Fowler is a 41 y.o. right handed male who presents with a right index finger laceration.  Patient states that he was at work and was using a sledgehammer when he missed and hit his right index finger.  He had immediate onset of pain and was bleeding profusely.  He thinks he may have seen bone.  He held pressure for about an hour and got the bleeding under control and came to the ED.  He reports numbness over the medial and palmar aspect of the finger.  He is having difficulty flexing the finger due to pain.  Tetanus is up-to-date  HPI  Past Medical History:  Diagnosis Date  . 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 Epididymitis   . Pancreatitis   . Seizures (HCC)    last grand mal was in 06/2012  . Stomach ulcer   . Testicular torsion     Patient Active Problem List   Diagnosis Date Noted  . Altered mental status   . S/P spinal fusion   . Encephalopathy acute   . Post-operative state   . Post-op pain   . Back pain 07/17/2014  . 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 BACK SURGERY    . KNEE ARTHROSCOPY     right and left  . PALATE / UVULA BIOPSY / EXCISION    . ROTATOR CUFF REPAIR     left  . SHOULDER ARTHROSCOPY     left  . TESTICULAR EXPLORATION    . TONSILLECTOMY    . TRANSFORAMINAL LUMBAR INTERBODY FUSION (TLIF) WITH PEDICLE SCREW FIXATION 1 LEVEL  07/17/2014   L 4 L5  . WISDOM TOOTH EXTRACTION         Home Medications    Prior to Admission medications   Medication Sig Start Date End Date Taking? Authorizing Provider  docusate sodium (COLACE) 100 MG capsule Take 1 capsule (100 mg total) by mouth 3 (three) times daily as needed for mild  constipation. Patient not taking: Reported on 03/26/2015 07/23/14   Venita LickBrooks, Dahari, MD  Erythromycin 2 % ointment Apply a ribbon of ointment to both eyes three times daily 08/29/16   Long, Arlyss RepressJoshua G, MD  HYDROcodone-acetaminophen (NORCO/VICODIN) 5-325 MG tablet Take 2 tablets by mouth every 4 (four) hours as needed. 08/29/16   Long, Arlyss RepressJoshua G, MD  ibuprofen (ADVIL,MOTRIN) 800 MG tablet Take 1 tablet (800 mg total) by mouth 3 (three) times daily. Patient not taking: Reported on 08/29/2016 06/29/15   Cheri Fowlerose, Kayla, PA-C  methocarbamol (ROBAXIN) 500 MG tablet Take 1 tablet (500 mg total) by mouth 2 (two) times daily. 09/09/16   Elpidio AnisUpstill, Shari, PA-C  ondansetron (ZOFRAN) 4 MG tablet Take 1 tablet (4 mg total) by mouth every 8 (eight) hours as needed for nausea or vomiting. Patient not taking: Reported on 03/26/2015 07/23/14   Venita LickBrooks, Dahari, MD  oxyCODONE-acetaminophen (PERCOCET/ROXICET) 5-325 MG tablet Take 1-2 tablets by mouth every 4 (four) hours as needed for severe pain. 09/09/16   Elpidio AnisUpstill, Shari, PA-C  predniSONE (DELTASONE) 20 MG tablet Take 2 tablets (40 mg total) by mouth daily with breakfast. Patient not taking: Reported on 08/29/2016 08/22/15   Marlise EvesMohr,  Joselyn Glassman, PA-C    Family History Family History  Problem Relation Age of Onset  . Hypertension Other   . Diabetes Father     Social History Social History   Tobacco Use  . Smoking status: Current Some Day Smoker    Types: Cigarettes  . Smokeless tobacco: Current User    Types: Chew  Substance Use Topics  . Alcohol use: Yes    Comment: occasionally   . Drug use: No     Allergies   Versed [midazolam] and Anti-inflammatory enzyme [nutritional supplements]   Review of Systems Review of Systems  Musculoskeletal: Positive for arthralgias.  Skin: Positive for wound.  Neurological: Positive for weakness and numbness.    Physical Exam Updated Vital Signs BP (!) 146/80 (BP Location: Left Arm)   Pulse 88   Temp 99.2 F (37.3 C) (Oral)    Resp 14   Ht 5\' 11"  (1.803 m)   Wt 97.5 kg (215 lb)   SpO2 98%   BMI 29.99 kg/m   Physical Exam  Constitutional: He is oriented to person, place, and time. He appears well-developed and well-nourished. No distress.  HENT:  Head: Normocephalic and atraumatic.  Eyes: Conjunctivae are normal. Pupils are equal, round, and reactive to light. Right eye exhibits no discharge. Left eye exhibits no discharge. No scleral icterus.  Neck: Normal range of motion.  Cardiovascular: Normal rate.  Pulmonary/Chest: Effort normal. No respiratory distress.  Abdominal: He exhibits no distension.  Musculoskeletal:  Right index finger: 2 cm V shaped laceration over the lateral aspect of the finger over the PIP joint. Bleeding is currently controlled. His is unable to flex his finger at the PIP joint. N/V intact.   Neurological: He is alert and oriented to person, place, and time.  Skin: Skin is warm and dry.  Psychiatric: He has a normal mood and affect. His behavior is normal.  Nursing note and vitals reviewed.      ED Treatments / Results  Labs (all labs ordered are listed, but only abnormal results are displayed) Labs Reviewed - No data to display  EKG  EKG Interpretation None       Radiology Dg Finger Index Right  Result Date: 03/13/2017 CLINICAL DATA:  Finger laceration EXAM: RIGHT INDEX FINGER 2+V COMPARISON:  None. FINDINGS: No subluxation. Possible punctate bone fragments adjacent to the base of the second middle phalanx. IMPRESSION: Possible punctate bone fragment adjacent to the base of the second middle phalanx. Adjacent soft tissue laceration. Electronically Signed   By: Jasmine Pang M.D.   On: 03/13/2017 18:51    Procedures .Marland KitchenLaceration Repair Date/Time: 03/13/2017 10:34 PM Performed by: Bethel Born, PA-C Authorized by: Bethel Born, PA-C   Consent:    Consent obtained:  Verbal   Consent given by:  Patient   Risks discussed:  Infection, pain, need for  additional repair and nerve damage   Alternatives discussed:  No treatment Anesthesia (see MAR for exact dosages):    Anesthesia method:  Local infiltration   Local anesthetic:  Lidocaine 2% w/o epi Laceration details:    Location:  Finger   Finger location:  R index finger   Length (cm):  3   Depth (mm):  10 Repair type:    Repair type:  Simple Pre-procedure details:    Preparation:  Patient was prepped and draped in usual sterile fashion and imaging obtained to evaluate for foreign bodies Exploration:    Hemostasis achieved with:  Direct pressure and tourniquet   Wound  exploration: wound explored through full range of motion and entire depth of wound probed and visualized     Wound extent: foreign bodies/material and underlying fracture     Wound extent: no tendon damage noted and no vascular damage noted     Contaminated: yes   Treatment:    Area cleansed with:  Saline   Amount of cleaning:  Standard   Irrigation solution:  Sterile water   Irrigation volume:  200cc   Irrigation method:  Syringe   Visualized foreign bodies/material removed: yes   Skin repair:    Repair method:  Sutures   Suture size:  5-0   Suture material:  Prolene   Suture technique:  Simple interrupted   Number of sutures:  7 Approximation:    Approximation:  Close   Vermilion border: well-aligned   Post-procedure details:    Dressing:  Antibiotic ointment, sterile dressing and splint for protection   Patient tolerance of procedure:  Tolerated well, no immediate complications Comments:     No bone was visualized   (including critical care time)    Medications Ordered in ED Medications  oxyCODONE-acetaminophen (PERCOCET/ROXICET) 5-325 MG per tablet 1 tablet (1 tablet Oral Given 03/13/17 1811)  lidocaine (XYLOCAINE) 2 % (with pres) injection 400 mg (400 mg Intradermal Given 03/13/17 1917)     Initial Impression / Assessment and Plan / ED Course  I have reviewed the triage vital signs and the  nursing notes.  Pertinent labs & imaging results that were available during my care of the patient were reviewed by me and considered in my medical decision making (see chart for details).  41 year old male presents with right index finger injury. It was repaired and irrigated in the ED. Bottom of the wound visualized and bleeding controlled. 7 sutures placed. Wound care discussed and advised to return to have stitches removed in 10 days. The patient's wound was complicated by inability to flex the finger completely. He was able to partially flex it. This is unusual since the laceration was primarily over the dorsal side of the finger. Shared visit with Dr. Criss AlvineGoldston who spoke with Dr. Mina MarbleWeingold with hand surgery. He recommends Augmentin, splint, and f/u in office on Thursday. Return precautions discussed.   Final Clinical Impressions(s) / ED Diagnoses   Final diagnoses:  Open nondisplaced fracture of phalanx of right index finger, unspecified phalanx, initial encounter    ED Discharge Orders    None       Bethel BornGekas, Lizzett Nobile Marie, PA-C 03/13/17 2237    Pricilla LovelessGoldston, Scott, MD 03/15/17 [phone]

## 2017-03-13 NOTE — ED Notes (Signed)
Paged Ortho  

## 2017-08-27 ENCOUNTER — Encounter (HOSPITAL_COMMUNITY): Payer: Self-pay | Admitting: Emergency Medicine

## 2017-08-27 ENCOUNTER — Emergency Department (HOSPITAL_COMMUNITY): Payer: Self-pay

## 2017-08-27 ENCOUNTER — Emergency Department (HOSPITAL_COMMUNITY)
Admission: EM | Admit: 2017-08-27 | Discharge: 2017-08-27 | Disposition: A | Discharge status: Home / Self Care | Payer: Self-pay | Attending: Emergency Medicine | Admitting: Emergency Medicine

## 2017-08-27 DIAGNOSIS — M79642 Pain in left hand: Secondary | ICD-10-CM | POA: Insufficient documentation

## 2017-08-27 DIAGNOSIS — Z5321 Procedure and treatment not carried out due to patient leaving prior to being seen by health care provider: Secondary | ICD-10-CM | POA: Insufficient documentation

## 2017-08-27 NOTE — ED Notes (Signed)
Called for patient in waiting room no answer x 3  

## 2017-08-27 NOTE — ED Notes (Signed)
Patient does not respond

## 2017-08-27 NOTE — ED Notes (Signed)
Patient called x 2 with no response. 

## 2017-08-27 NOTE — ED Triage Notes (Signed)
Pt states 2 weeks ago he was treated with antibiotics for infection to left ring finger. Went away. Then he states Thursday he noticed both knees were purple but they turned back to normal. Then states Saturday morning the left hand was in pain that woke him up. Pt has pain to left pointer finger. Pain with movement.

## 2017-11-09 ENCOUNTER — Emergency Department (HOSPITAL_BASED_OUTPATIENT_CLINIC_OR_DEPARTMENT_OTHER): Payer: Self-pay

## 2017-11-09 ENCOUNTER — Other Ambulatory Visit: Payer: Self-pay

## 2017-11-09 ENCOUNTER — Emergency Department (HOSPITAL_BASED_OUTPATIENT_CLINIC_OR_DEPARTMENT_OTHER)
Admission: EM | Admit: 2017-11-09 | Discharge: 2017-11-10 | Disposition: A | Discharge status: Home / Self Care | Payer: Self-pay | Attending: Emergency Medicine | Admitting: Emergency Medicine

## 2017-11-09 ENCOUNTER — Encounter (HOSPITAL_BASED_OUTPATIENT_CLINIC_OR_DEPARTMENT_OTHER): Payer: Self-pay | Admitting: *Deleted

## 2017-11-09 DIAGNOSIS — T7840XA Allergy, unspecified, initial encounter: Secondary | ICD-10-CM | POA: Insufficient documentation

## 2017-11-09 DIAGNOSIS — T63441A Toxic effect of venom of bees, accidental (unintentional), initial encounter: Secondary | ICD-10-CM | POA: Insufficient documentation

## 2017-11-09 DIAGNOSIS — Y929 Unspecified place or not applicable: Secondary | ICD-10-CM | POA: Insufficient documentation

## 2017-11-09 DIAGNOSIS — Z79899 Other long term (current) drug therapy: Secondary | ICD-10-CM | POA: Insufficient documentation

## 2017-11-09 DIAGNOSIS — F17210 Nicotine dependence, cigarettes, uncomplicated: Secondary | ICD-10-CM | POA: Insufficient documentation

## 2017-11-09 DIAGNOSIS — W010XXA Fall on same level from slipping, tripping and stumbling without subsequent striking against object, initial encounter: Secondary | ICD-10-CM | POA: Insufficient documentation

## 2017-11-09 DIAGNOSIS — M25521 Pain in right elbow: Secondary | ICD-10-CM | POA: Insufficient documentation

## 2017-11-09 DIAGNOSIS — W19XXXA Unspecified fall, initial encounter: Secondary | ICD-10-CM

## 2017-11-09 DIAGNOSIS — Y999 Unspecified external cause status: Secondary | ICD-10-CM | POA: Insufficient documentation

## 2017-11-09 DIAGNOSIS — Y9301 Activity, walking, marching and hiking: Secondary | ICD-10-CM | POA: Insufficient documentation

## 2017-11-09 LAB — BASIC METABOLIC PANEL
Anion gap: 9 (ref 5–15)
BUN: 9 mg/dL (ref 6–20)
CHLORIDE: 102 mmol/L (ref 98–111)
CO2: 23 mmol/L (ref 22–32)
Calcium: 8.7 mg/dL — ABNORMAL LOW (ref 8.9–10.3)
Creatinine, Ser: 1.28 mg/dL — ABNORMAL HIGH (ref 0.61–1.24)
GFR calc non Af Amer: >60 mL/min (ref >60)
Glucose, Bld: 85 mg/dL (ref 70–99)
POTASSIUM: 3.3 mmol/L — AB (ref 3.5–5.1)
SODIUM: 134 mmol/L — AB (ref 135–145)

## 2017-11-09 LAB — CBC
HEMATOCRIT: 46.6 % (ref 39.0–52.0)
HEMOGLOBIN: 16.5 g/dL (ref 13.0–17.0)
MCH: 31.5 pg (ref 26.0–34.0)
MCHC: 35.4 g/dL (ref 30.0–36.0)
MCV: 88.9 fL (ref 78.0–100.0)
Platelets: 237 10*3/uL (ref 150–400)
RBC: 5.24 MIL/uL (ref 4.22–5.81)
RDW: 14.2 % (ref 11.5–15.5)
WBC: 16.8 10*3/uL — ABNORMAL HIGH (ref 4.0–10.5)

## 2017-11-09 MED ORDER — METHYLPREDNISOLONE SODIUM SUCC 125 MG IJ SOLR
125.0000 mg | Freq: Once | INTRAMUSCULAR | Status: AC
  Administered 2017-11-09: 125 mg via INTRAVENOUS
  Filled 2017-11-09: qty 2

## 2017-11-09 MED ORDER — DIPHENHYDRAMINE HCL 50 MG/ML IJ SOLN
50.0000 mg | Freq: Once | INTRAMUSCULAR | Status: AC
  Administered 2017-11-09: 50 mg via INTRAVENOUS
  Filled 2017-11-09: qty 1

## 2017-11-09 MED ORDER — FAMOTIDINE IN NACL 20-0.9 MG/50ML-% IV SOLN
20.0000 mg | Freq: Once | INTRAVENOUS | Status: AC
  Administered 2017-11-09: 20 mg via INTRAVENOUS
  Filled 2017-11-09: qty 50

## 2017-11-09 MED ORDER — SODIUM CHLORIDE 0.9 % IV SOLN
INTRAVENOUS | Status: DC
  Administered 2017-11-10: via INTRAVENOUS

## 2017-11-09 MED ORDER — SODIUM CHLORIDE 0.9 % IV BOLUS
1000.0000 mL | Freq: Once | INTRAVENOUS | Status: AC
  Administered 2017-11-09: 1000 mL via INTRAVENOUS

## 2017-11-09 NOTE — ED Notes (Signed)
Pt. Has edema noted on the R elbow and R shoulder area.  Pt. Able to move the fingers on the R hand.  Pt. Fell on the R arm while running from the bees he while being stung.

## 2017-11-09 NOTE — ED Triage Notes (Signed)
Pt. Reports he was stung approx. 20 times by bees.  Pt. Has swollen  L eye almost shut completely.  Pt. Reports the R elbow and R shoulder hurting due to falling on the R arm while being stung. Pt. Fell while running from the bees.  Pt. Has a good pulse in the R radial per RN Sophie G.

## 2017-11-09 NOTE — ED Provider Notes (Addendum)
[facility] EMERGENCY DEPARTMENT Provider Note   CSN: [id number] Arrival date & time: 11/09/17  2203     History   Chief Complaint Chief Complaint  Patient presents with  . Tick Removal    Bee Stings multiple    HPI Ralph Fowler is a 42 y.o. male.  Patient status post multiple bee stings most likely from yellow jackets which occurred shortly prior to arrival.  Patient was walking through the woods to visit his neighbor.  When he stumbled over a log and fell.  Patient with complaint of pain to the right elbow right arm from the fall.  And then he got stung on the left eye and then started to run and they followed him and he had multiple stings.  Patient with no known allergy to bee stings in the past.  Patient without any tongue swelling or lip swelling.  Patient feels very fatigued.  Feeling his breath he may pass out.  No trouble breathing.     Past Medical History:  Diagnosis Date  . 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 Epididymitis   . Pancreatitis   . Seizures (HCC)    last grand mal was in 06/2012  . Stomach ulcer   . Testicular torsion     Patient Active Problem List   Diagnosis Date Noted  . Altered mental status   . S/P spinal fusion   . Encephalopathy acute   . Post-operative state   . Post-op pain   . Back pain 07/17/2014  . 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 BACK SURGERY    . KNEE ARTHROSCOPY     right and left  . PALATE / UVULA BIOPSY / EXCISION    . ROTATOR CUFF REPAIR     left  . SHOULDER ARTHROSCOPY     left  . TESTICULAR EXPLORATION    . TONSILLECTOMY    . TRANSFORAMINAL LUMBAR INTERBODY FUSION (TLIF) WITH PEDICLE SCREW FIXATION 1 LEVEL  07/17/2014   L 4 L5  . WISDOM TOOTH EXTRACTION          Home Medications    Prior to Admission medications   Medication Sig Start Date End Date Taking? Authorizing Provider    amoxicillin-clavulanate (AUGMENTIN) 875-125 MG tablet Take 1 tablet 2 (two) times daily by mouth. 03/13/17   Bethel BornGekas, Kelly Marie, PA-C  Erythromycin 2 % ointment Apply a ribbon of ointment to both eyes three times daily 08/29/16   Long, Arlyss RepressJoshua G, MD  HYDROcodone-acetaminophen (NORCO/VICODIN) 5-325 MG tablet Take 2 tablets by mouth every 4 (four) hours as needed. 08/29/16   Long, Arlyss RepressJoshua G, MD  methocarbamol (ROBAXIN) 500 MG tablet Take 1 tablet (500 mg total) by mouth 2 (two) times daily. 09/09/16   Elpidio AnisUpstill, Shari, PA-C  oxyCODONE-acetaminophen (PERCOCET/ROXICET) 5-325 MG tablet Take 2 tablets every 4 (four) hours as needed by mouth for severe pain. 03/13/17   Bethel BornGekas, Kelly Marie, PA-C    Family History Family History  Problem Relation Age of Onset  . Hypertension Other   . Diabetes Father     Social History Social History   Tobacco Use  . Smoking status: Current Some Day Smoker    Types: Cigarettes  . Smokeless tobacco: Current User    Types: Chew  Substance Use Topics  . Alcohol use: Yes    Comment: occasionally   . Drug use: No  Allergies   Versed [midazolam] and Anti-inflammatory enzyme [nutritional supplements]   Review of Systems Review of Systems  Constitutional: Positive for fatigue.  HENT: Positive for facial swelling. Negative for trouble swallowing.   Eyes: Negative for redness.  Respiratory: Negative for shortness of breath and wheezing.   Cardiovascular: Negative for chest pain.  Gastrointestinal: Negative for abdominal pain, nausea and vomiting.  Genitourinary: Negative for dysuria.  Musculoskeletal: Positive for joint swelling.  Skin: Positive for rash.  Neurological: Positive for light-headedness. Negative for syncope.  Hematological: Does not bruise/bleed easily.  Psychiatric/Behavioral: Negative for confusion.     Physical Exam Updated Vital Signs BP 133/90   Pulse 82   Temp 99.1 F (37.3 C)   Resp (!) 21   Ht 1.803 m (5\' 11" )   Wt 93 kg (205  lb)   SpO2 100%   BMI 28.59 kg/m   Physical Exam  Constitutional: He is oriented to person, place, and time. He appears well-developed and well-nourished. No distress.  HENT:  Head: Normocephalic and atraumatic.  Mouth/Throat: Oropharynx is clear and moist.  Swelling to the left upper eyelid.  No lip swelling no tongue swelling.  Face is erythematous but no significant facial swelling other than the left eyelid.  Eyes: Pupils are equal, round, and reactive to light. Conjunctivae and EOM are normal.  Neck: Neck supple.  Cardiovascular: Normal rate, regular rhythm and normal heart sounds.  Pulmonary/Chest: Effort normal and breath sounds normal. No respiratory distress. He has no wheezes.  Abdominal: Soft. Bowel sounds are normal. There is no tenderness.  Musculoskeletal: Normal range of motion. He exhibits edema and tenderness.  Some swelling tenderness to palpation to right elbow right arm no obvious deformity.  Radial pulses 2+.  Good cap refill to the fingers.  Neurological: He is alert and oriented to person, place, and time. No cranial nerve deficit or sensory deficit. He exhibits normal muscle tone. Coordination normal.  Skin: Skin is warm. Capillary refill takes less than 2 seconds. Rash noted. There is erythema.  Mostly erythema some splotchiness no hives.  Nursing note and vitals reviewed.    ED Treatments / Results  Labs (all labs ordered are listed, but only abnormal results are displayed) Labs Reviewed  CBC - Abnormal; Notable for the following components:      Result Value   WBC 16.8 (*)    All other components within normal limits  BASIC METABOLIC PANEL - Abnormal; Notable for the following components:   Sodium 134 (*)    Potassium 3.3 (*)    Creatinine, Ser 1.28 (*)    Calcium 8.7 (*)    All other components within normal limits    EKG EKG Interpretation  Date/Time:  Thursday November 09 2017 22:14:37 EDT Ventricular Rate:  79 PR Interval:    QRS  Duration: 83 QT Interval:  362 QTC Calculation: 415 R Axis:   49 Text Interpretation:  Sinus rhythm Abnormal R-wave progression, early transition ST elevation, consider inferior injury Artifact No significant change since last tracing Confirmed by Vanetta Mulders [phone]) on 11/09/2017 10:28:00 PM   Radiology No results found.  Procedures Procedures (including critical care time)  Medications Ordered in ED Medications  0.9 %  sodium chloride infusion (has no administration in time range)  sodium chloride 0.9 % bolus 1,000 mL (1,000 mLs Intravenous New Bag/Given 11/09/17 2234)  famotidine (PEPCID) IVPB 20 mg premix (20 mg Intravenous New Bag/Given 11/09/17 2232)  diphenhydrAMINE (BENADRYL) injection 50 mg (50 mg Intravenous Given 11/09/17 2232)  methylPREDNISolone sodium succinate (SOLU-MEDROL) 125 mg/2 mL injection 125 mg (125 mg Intravenous Given 11/09/17 2232)     Initial Impression / Assessment and Plan / ED Course  I have reviewed the triage vital signs and the nursing notes.  Pertinent labs & imaging results that were available during my care of the patient were reviewed by me and considered in my medical decision making (see chart for details).     Patient with multiple bee stings.  Allergic reaction secondary to this.  No tongue or lip swelling.  Patient also may have injury to right elbow or right arm following the fall.  Patient with good response to 125 mg of Solu-Medrol 50 mg Benadryl and 20 mg of Pepcid IV.  Erythema improving.  Patient stable from a respiratory and upper airway standpoint.  If x-rays without any acute findings patient will need discharge home on prednisone for the next 5 days Pepcid for the next 7 days Benadryl for the next 48 hours.  And will need EpiPen's.  Prescriptions will be provided for all this.  Formal read of x-ray still pending chest x-ray to my read as negative right elbow negative right humerus negative.  We will treat him with a sling and  follow-up with his regular doctor and Naprosyn for the pain.  Final Clinical Impressions(s) / ED Diagnoses   Final diagnoses:  Allergic reaction, initial encounter  Fall, initial encounter  Right elbow pain    ED Discharge Orders    None       Vanetta Mulders, MD 11/09/17 2350    Vanetta Mulders, MD 11/10/17 [phone]

## 2017-11-10 MED ORDER — FAMOTIDINE 20 MG PO TABS: 20.0000 mg | ORAL_TABLET | Freq: Two times a day (BID) | ORAL | 0 refills | Status: AC

## 2017-11-10 MED ORDER — DIPHENHYDRAMINE HCL 25 MG PO TABS: 25.0000 mg | ORAL_TABLET | Freq: Four times a day (QID) | ORAL | 0 refills | Status: AC

## 2017-11-10 MED ORDER — NAPROXEN 500 MG PO TABS: 500.0000 mg | ORAL_TABLET | Freq: Two times a day (BID) | ORAL | 0 refills | Status: AC

## 2017-11-10 MED ORDER — KETOROLAC TROMETHAMINE 30 MG/ML IJ SOLN
30.0000 mg | Freq: Once | INTRAMUSCULAR | Status: AC
  Administered 2017-11-10: 30 mg via INTRAVENOUS
  Filled 2017-11-10: qty 1

## 2017-11-10 MED ORDER — PREDNISONE 10 MG PO TABS: 40.0000 mg | ORAL_TABLET | Freq: Every day | ORAL | 0 refills | Status: AC

## 2017-11-10 MED ORDER — EPINEPHRINE 0.3 MG/0.3ML IJ SOAJ: 0.3000 mg | Freq: Once | INTRAMUSCULAR | 2 refills | Status: AC

## 2017-11-10 NOTE — ED Notes (Signed)
ED Provider at bedside. 

## 2017-11-10 NOTE — Discharge Instructions (Addendum)
Take the Benadryl for the next 2 days.  Take the prednisone for the next 5 days.  Take the Pepcid for the next 7 days.  You been given a prescription for EpiPen.  To pack.  Keep one in your vehicle and one at home.  Stung by bees in the future would use it.  Use the sling as needed.  Make an appointment to follow-up with your doctor.  Take the Naprosyn for the arm pain.

## 2017-11-10 NOTE — ED Notes (Signed)
Cleaned Pt. L eye .Marland Kitchen.Marland Kitchen. Swelling to L eye is much better.  Pt. Able to open the L eye and able to see out of the L eye.

## 2017-12-24 ENCOUNTER — Emergency Department (HOSPITAL_BASED_OUTPATIENT_CLINIC_OR_DEPARTMENT_OTHER)
Admission: EM | Admit: 2017-12-24 | Discharge: 2017-12-24 | Disposition: A | Discharge status: Home / Self Care | Payer: Self-pay | Attending: Emergency Medicine | Admitting: Emergency Medicine

## 2017-12-24 ENCOUNTER — Other Ambulatory Visit: Payer: Self-pay

## 2017-12-24 ENCOUNTER — Encounter (HOSPITAL_BASED_OUTPATIENT_CLINIC_OR_DEPARTMENT_OTHER): Payer: Self-pay | Admitting: Emergency Medicine

## 2017-12-24 DIAGNOSIS — Y999 Unspecified external cause status: Secondary | ICD-10-CM | POA: Insufficient documentation

## 2017-12-24 DIAGNOSIS — Y9389 Activity, other specified: Secondary | ICD-10-CM | POA: Insufficient documentation

## 2017-12-24 DIAGNOSIS — F17210 Nicotine dependence, cigarettes, uncomplicated: Secondary | ICD-10-CM | POA: Insufficient documentation

## 2017-12-24 DIAGNOSIS — Y92410 Unspecified street and highway as the place of occurrence of the external cause: Secondary | ICD-10-CM | POA: Insufficient documentation

## 2017-12-24 DIAGNOSIS — S29012A Strain of muscle and tendon of back wall of thorax, initial encounter: Secondary | ICD-10-CM | POA: Insufficient documentation

## 2017-12-24 DIAGNOSIS — Z79899 Other long term (current) drug therapy: Secondary | ICD-10-CM | POA: Insufficient documentation

## 2017-12-24 MED ORDER — ORPHENADRINE CITRATE ER 100 MG PO TB12: 100.0000 mg | ORAL_TABLET | Freq: Two times a day (BID) | ORAL | 0 refills | Status: AC

## 2017-12-24 MED ORDER — NAPROXEN 500 MG PO TABS: 500.0000 mg | ORAL_TABLET | Freq: Two times a day (BID) | ORAL | 0 refills | Status: DC

## 2017-12-24 MED ORDER — NAPROXEN 500 MG PO TABS: 500.0000 mg | ORAL_TABLET | Freq: Two times a day (BID) | ORAL | 0 refills | Status: AC
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
## 2017-12-24 NOTE — Discharge Instructions (Signed)
1.  Take naproxen twice daily for the next 3 to 5 days.  Take Norflex twice daily for the next 3 to 5 days. 2.  Schedule follow-up with your spine surgeon this week if possible. 3.  Return to the emergency department if you develop weakness numbness or tingling to the legs.  Dysfunction of your bowel or bladder.  Significantly worsening or changing symptoms.
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
## 2018-03-18 IMAGING — CR DG THORACIC SPINE 2V
3 series · 3 of 3 positions shown · non-contrast
Comparison: Chest radiograph 08/21/2016, chest CT 05/29/2016

CLINICAL DATA: Fall off scaffolding with back pain radiating to the
left leg.

EXAM:
THORACIC SPINE 2 VIEWS

[t thoracic spine ap]
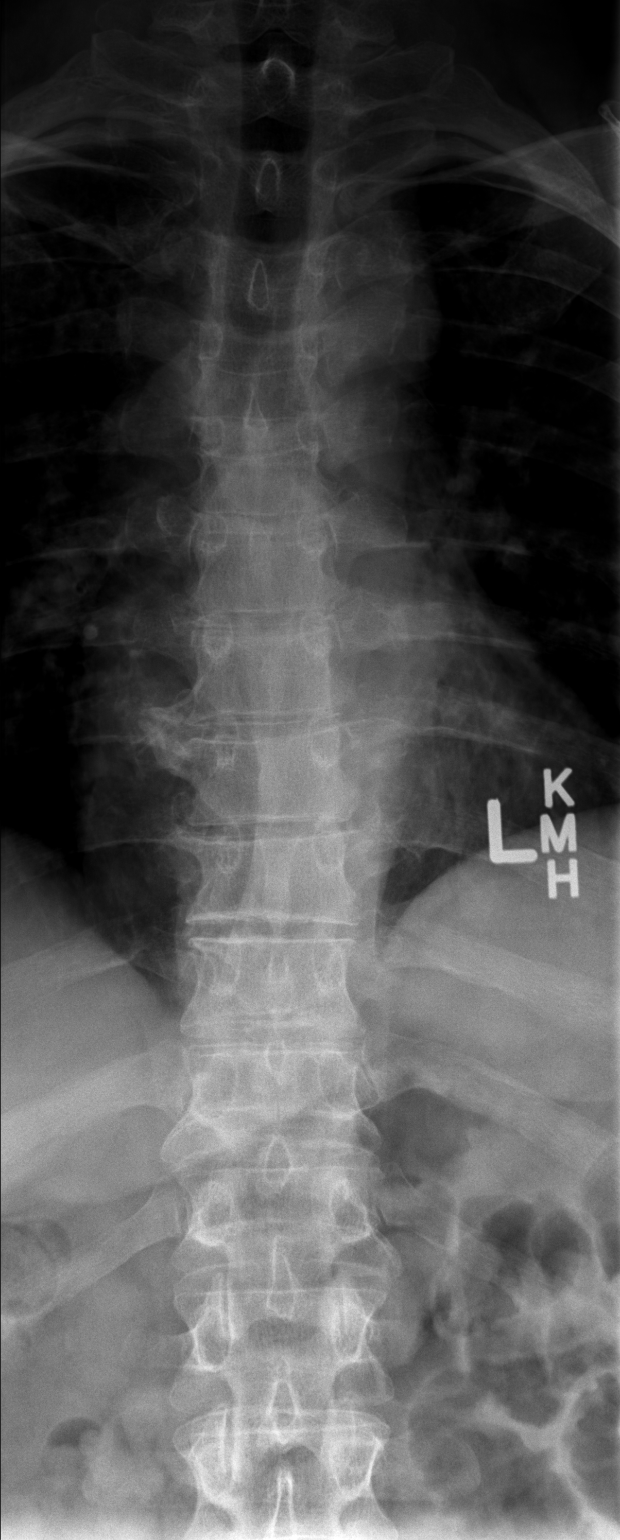

[t thoracic breathing lat]
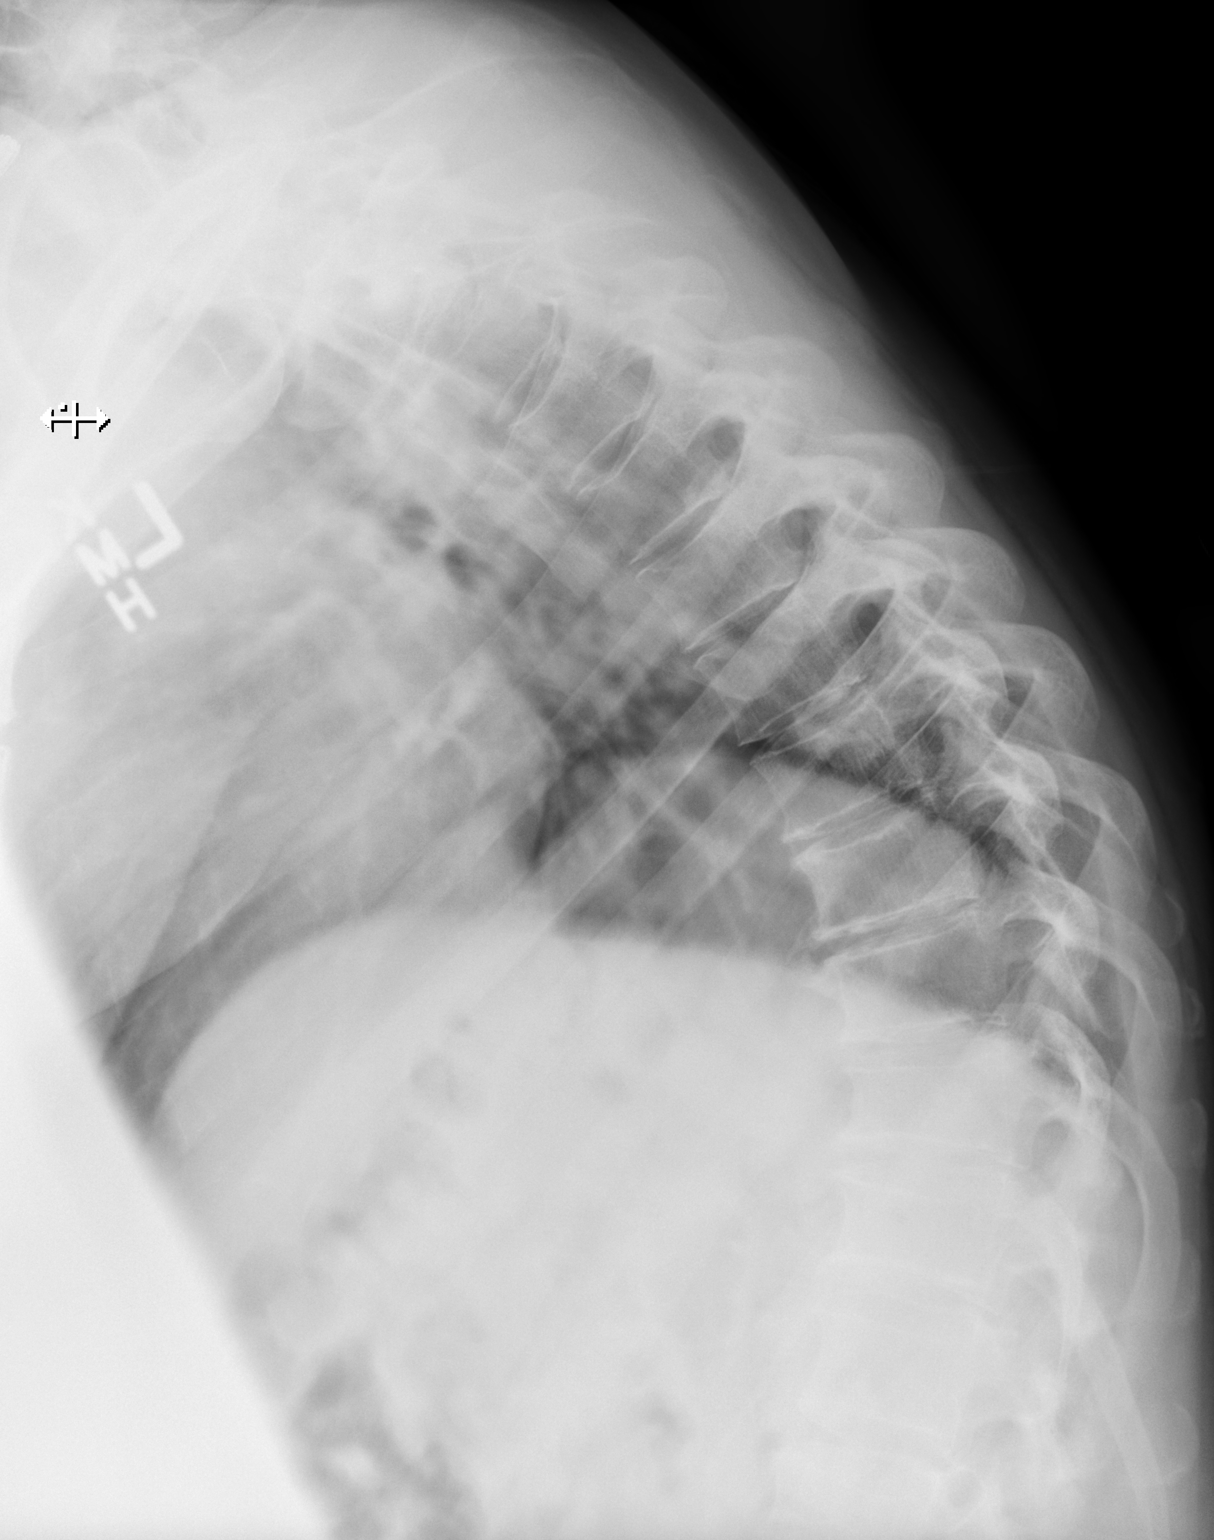

[t thoracic swimmers]
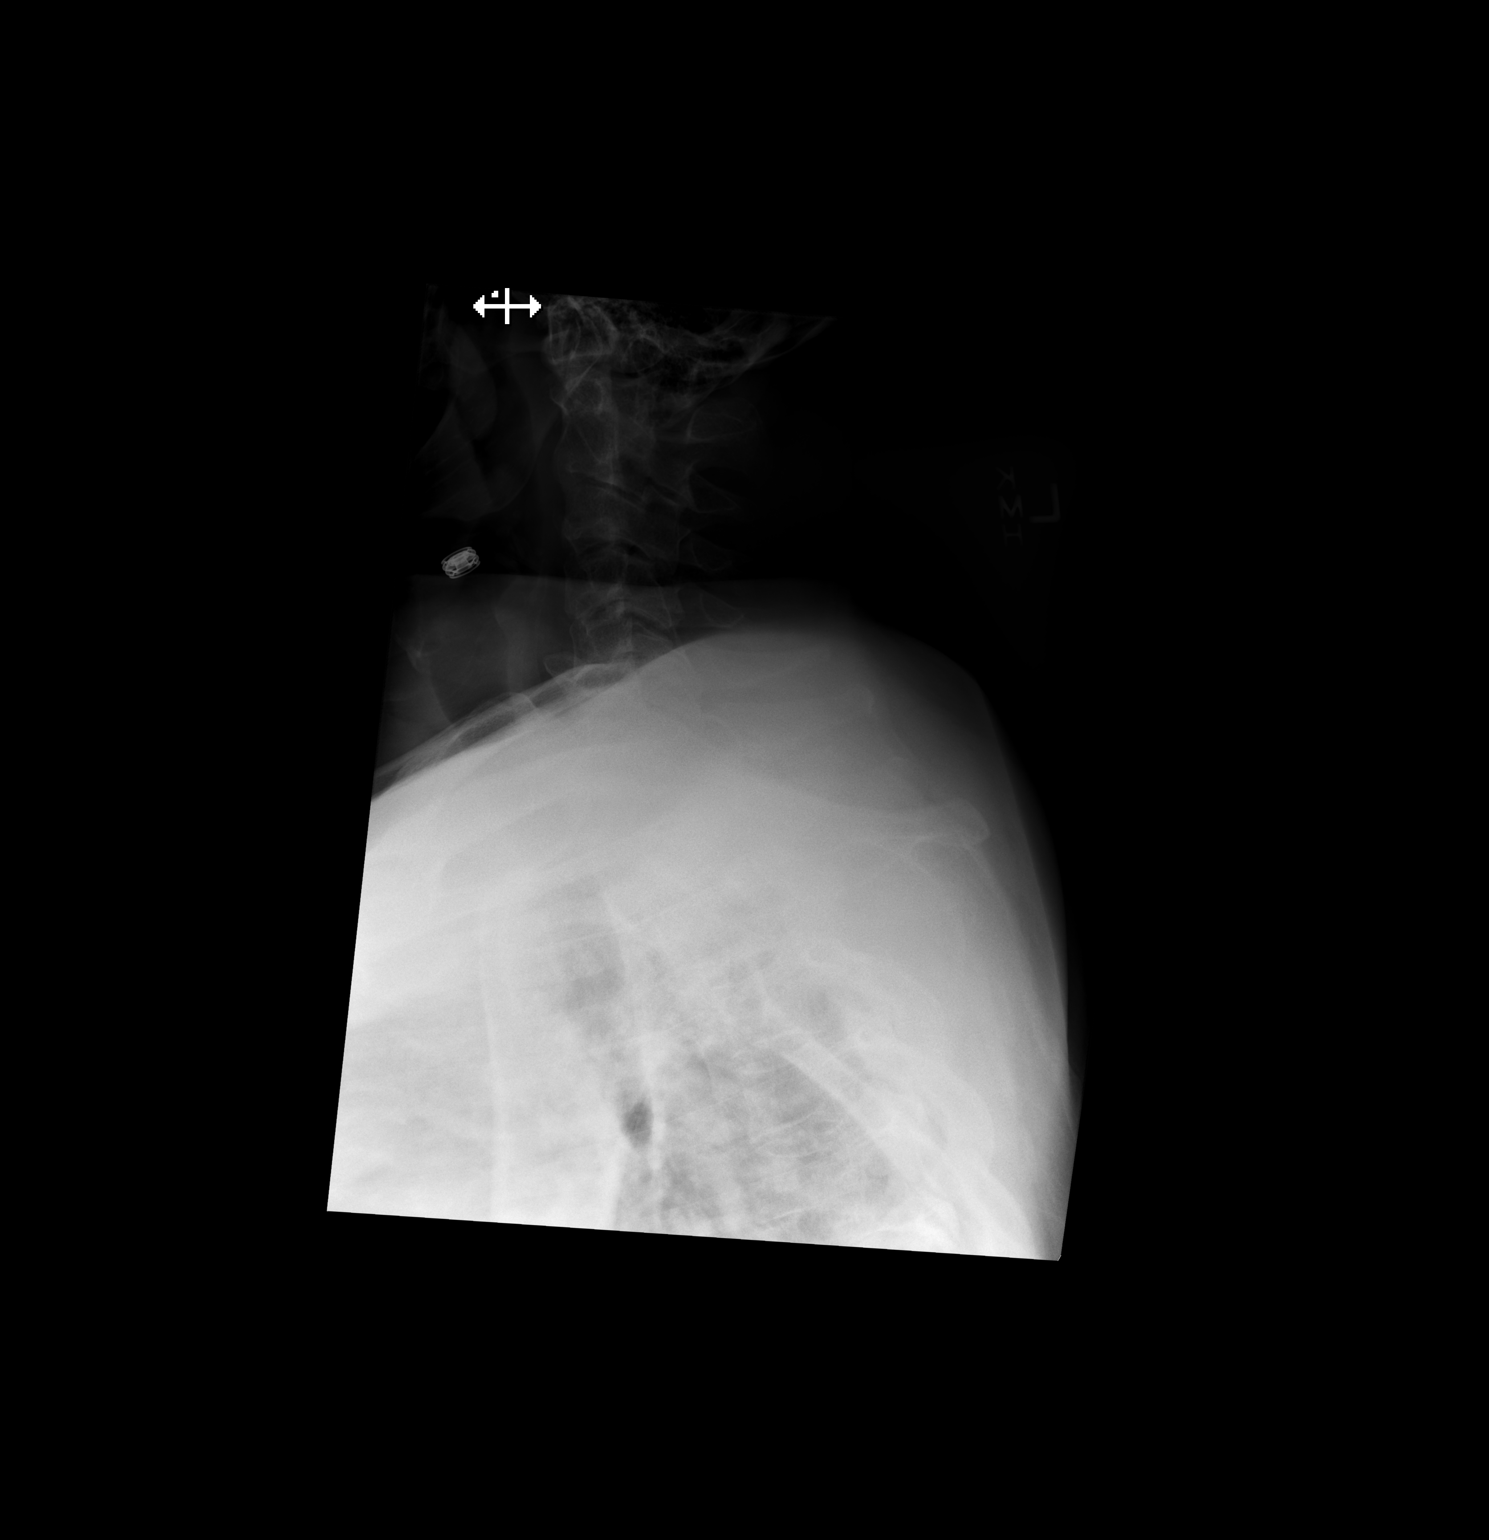

[3 of 3 positions shown; findings below may reference images not displayed]

FINDINGS: Exaggerated thoracic kyphosis, may be positional. Alignment is
otherwise maintained. Vertebral body heights are maintained. Mild
disc space narrowing and endplate spurring in the lower thoracic
spine. No acute fracture. Posterior elements appear intact. There is
no paravertebral soft tissue abnormality.
IMPRESSION: Exaggerated thoracic kyphosis may be positional or muscle spasm. No
evidence of acute fracture or subluxation of the thoracic spine.

## 2018-03-18 IMAGING — MR MR LUMBAR SPINE WO/W CM
4 of 7 series · 18 of 48 positions shown · IV contrast (multihance)
Comparison: 06/18/2016 lumbar spine radiographs. 01/14/2015 lumbar
spine MRI.

CLINICAL DATA: 41 y/o M; lower back pain with fall onto the left
side. History of lumbar surgery.

EXAM:
MRI LUMBAR SPINE WITHOUT AND WITH CONTRAST
TECHNIQUE: Multiplanar and multiecho pulse sequences of the lumbar spine were
obtained without and with intravenous contrast.
CONTRAST:  20mL MULTIHANCE GADOBENATE DIMEGLUMINE 529 MG/ML IV SOLN

[Series 3: T1 · sagittal · 5.0mm · 0.51mm/px · 3 of 14 slices shown (1 of 2)]
[im 1/14]
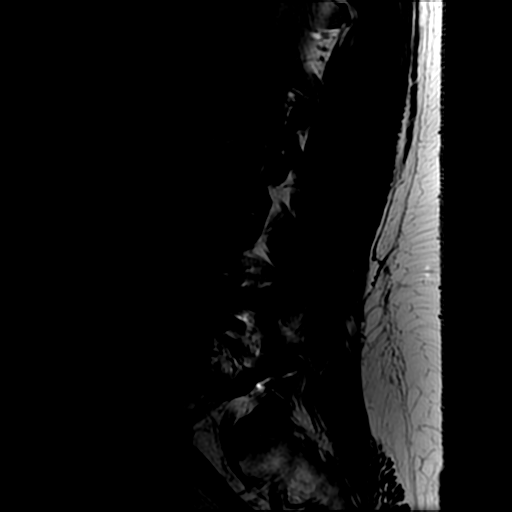
[im 7/14]
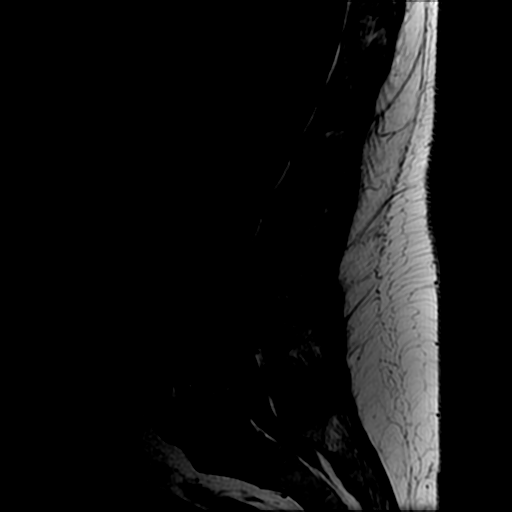
[im 14/14]
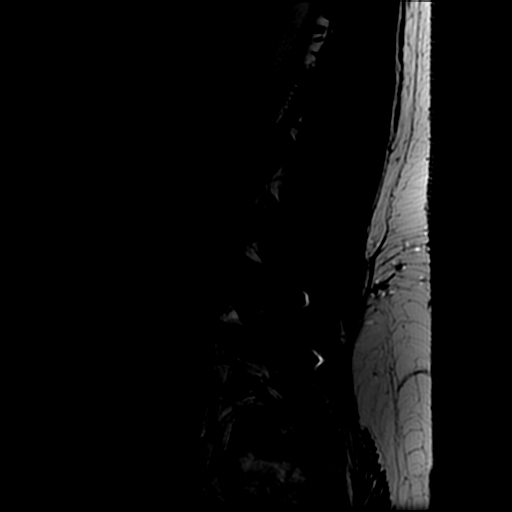

[Series 5: T2 · axial · 5.0mm · 0.39mm/px · z∈[-45,+167]mm · 9 of 40 slices shown]
[im 1/40]
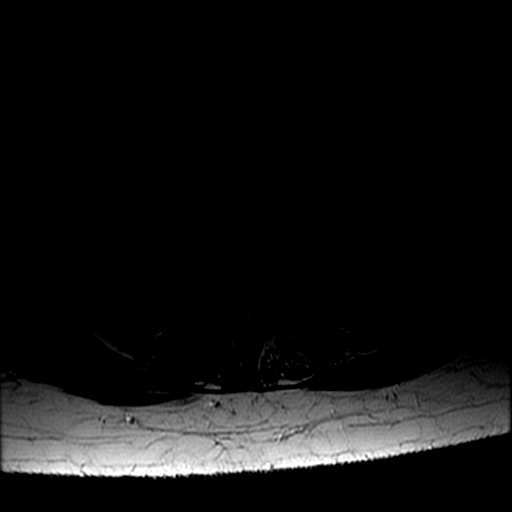
[im 4/40]
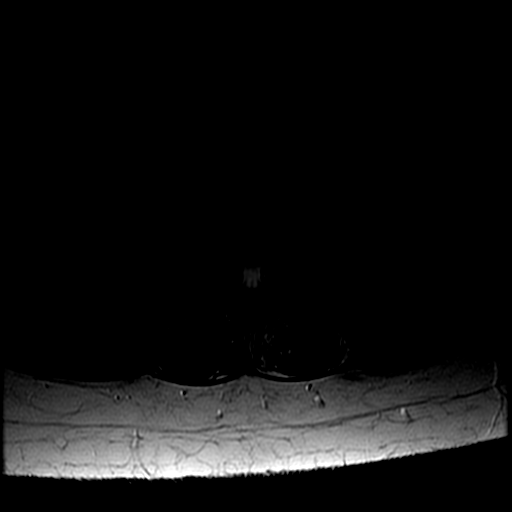
[im 8/40]
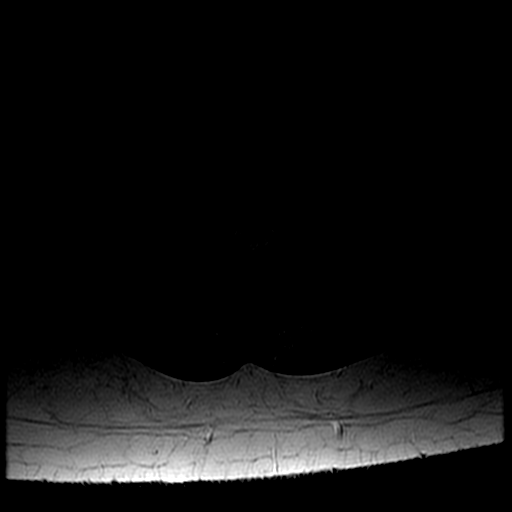
[im 12/40]
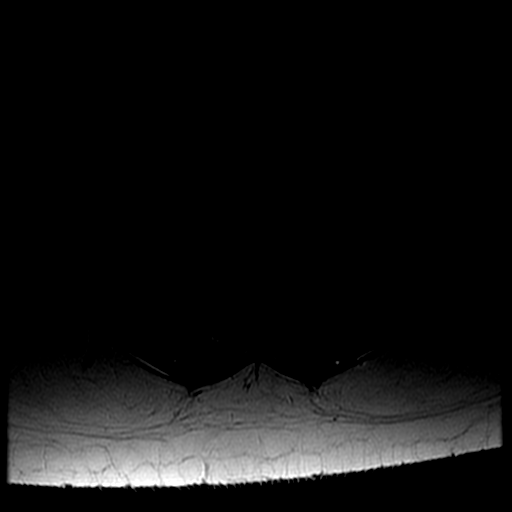
[im 16/40]
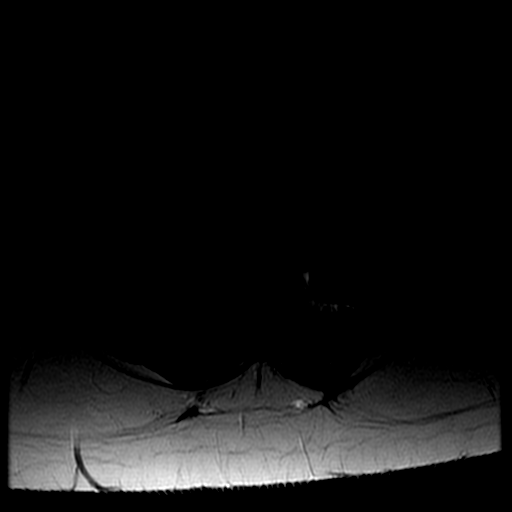
[im 20/40]
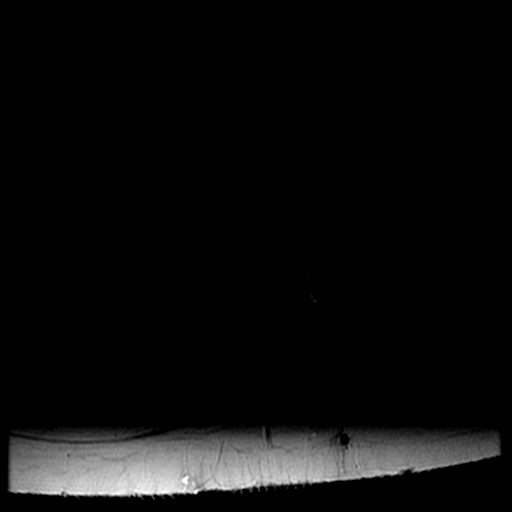
[im 24/40]
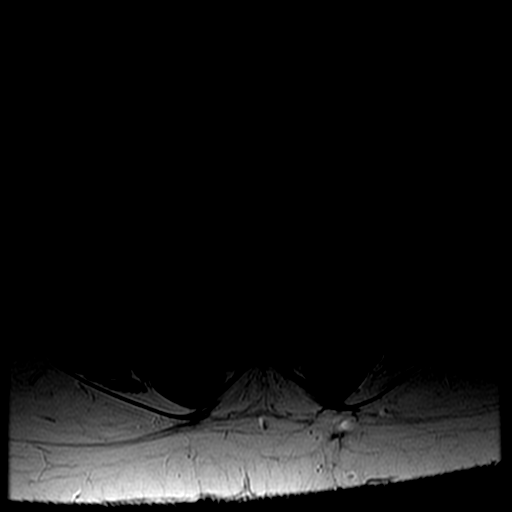
[im 28/40]
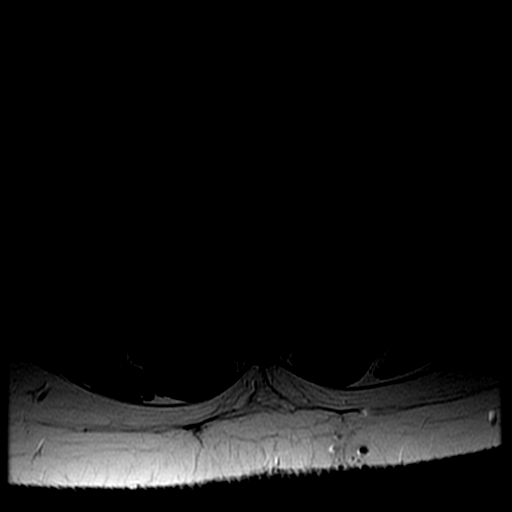
[im 36/40]
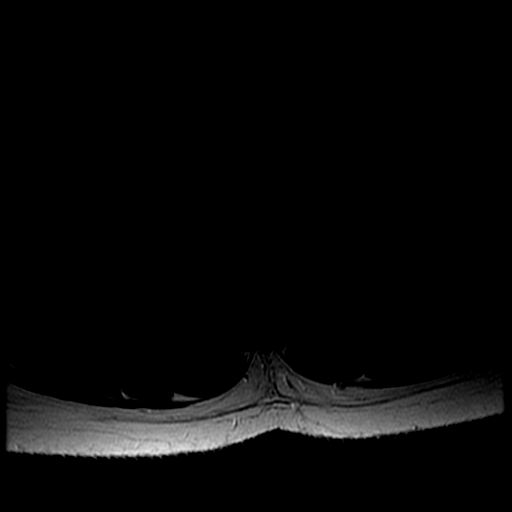

[Series 6: T1 · axial · 5.0mm · 0.39mm/px · z∈[-30,+167]mm · 3 of 40 slices shown (2 of 2)]
[im 4/40]
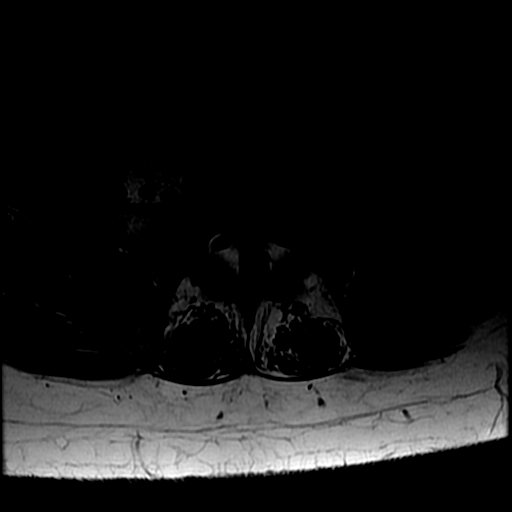
[im 20/40]
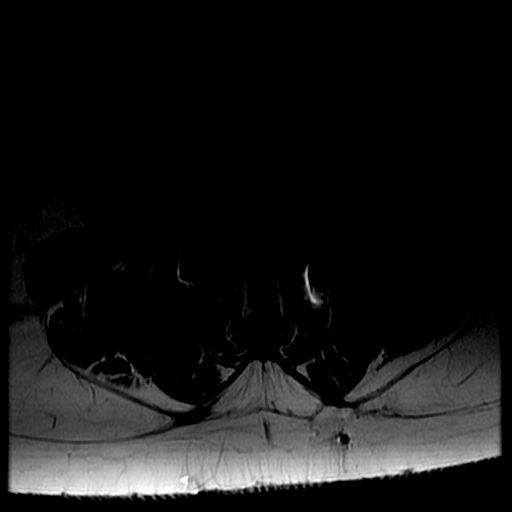
[im 36/40]
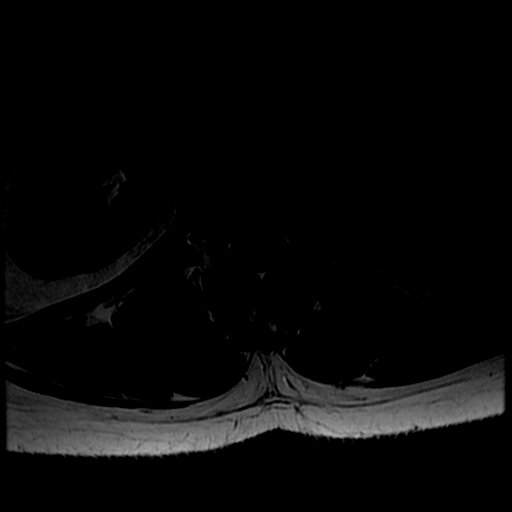

[Series 7: T2 post-contrast · sagittal · 5.0mm · 0.51mm/px · 3 of 14 slices shown]
[im 1/14]
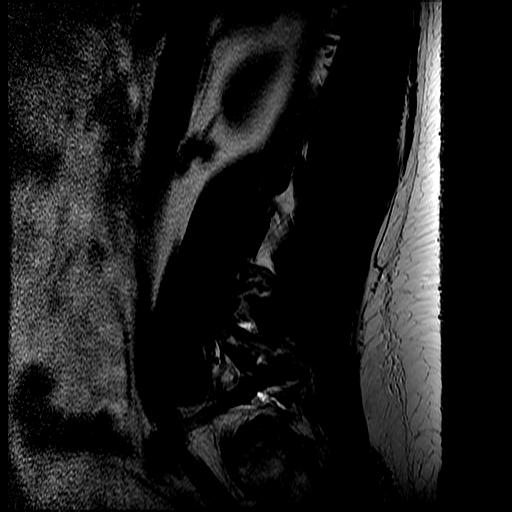
[im 9/14]
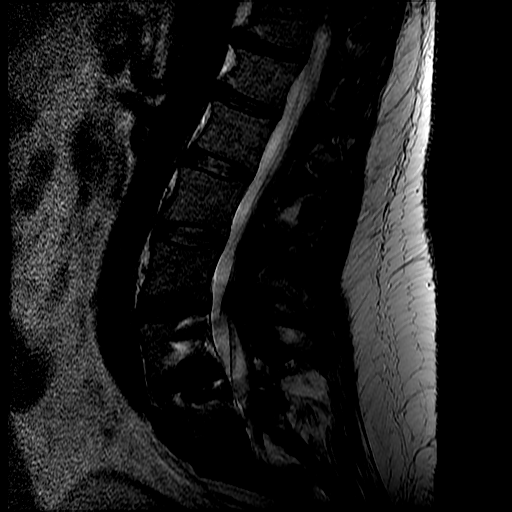
[im 14/14]
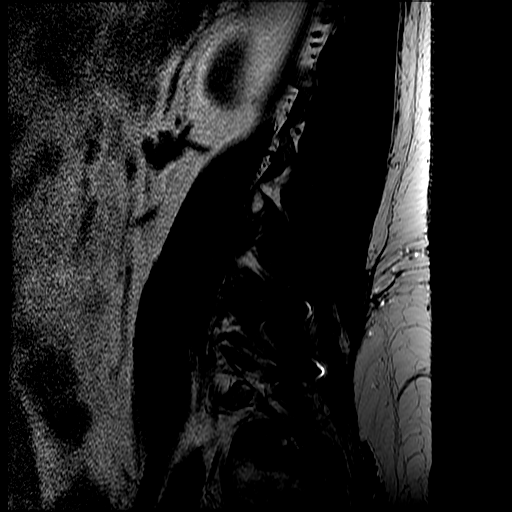

[18 of 48 positions shown; findings below may reference images not displayed]

FINDINGS: Segmentation:  Standard.

Alignment:  Physiologic.

Vertebrae: Susceptibility artifact from posterior and interbody
fusion hardware at the L4-5 levels partially obscures the vertebral
bodies and spinal canal at those levels. Trace bilateral L3-4 and
right-sided L5-S1 facet effusions are likely degenerative.
Additionally, there is a nonenhancing 9 mm T2 hyperintense structure
in the left paraspinal muscles posterior to the left L3-4 facet
joint which is likely a synovial cyst with mild surrounding edema.

Conus medullaris: Extends to the L1 level and appears normal.

Paraspinal and other soft tissues: Postsurgical changes within
paraspinal muscles and subcutaneous fat at the levels of fusion. No
discrete fluid collection is identified.

Disc levels:

L1-2: No significant disc displacement, foraminal narrowing, or
canal stenosis.

L2-3: No significant disc displacement, foraminal narrowing, or
canal stenosis.

L3-4: Stable small disc bulge and mild facet hypertrophy. No
significant foraminal narrowing or canal stenosis.

L4-5: Stable discectomy and fusion. Stable facet hypertrophy. No
significant foraminal narrowing or canal stenosis.

L5-S1: New 4 mm right subarticular and foraminal disc protrusion
combined with mild facet hypertrophy resulting in mild right
foraminal narrowing. No significant canal stenosis.
IMPRESSION: 1. No acute osseous abnormality identified.
2. Trace degenerative L3-4 and L5-S1 bilateral facet effusions.
Synovial cyst in left paraspinal muscles with mild surrounding edema
associated with the left L3-4 facet joint.
3. New small right subarticular and foraminal L5-S1 disc protrusion
with mild right foraminal narrowing.
4. Otherwise stable lumbar degenerative changes. No significant
canal stenosis.

By: Auntyjatty Delowr M.D.
